# Patient Record
Sex: Female | Born: 1980 | Race: White | Hispanic: No | Marital: Single | Smoking: Current every day smoker
Health system: Southern US, Community
[De-identification: ages and names within clinical notes are randomized; demographics above are authoritative.]

## PROBLEM LIST (undated history)

## (undated) ENCOUNTER — Inpatient Hospital Stay (HOSPITAL_COMMUNITY): Payer: Self-pay

## (undated) DIAGNOSIS — N200 Calculus of kidney: Secondary | ICD-10-CM

## (undated) DIAGNOSIS — B1920 Unspecified viral hepatitis C without hepatic coma: Secondary | ICD-10-CM

## (undated) DIAGNOSIS — F1910 Other psychoactive substance abuse, uncomplicated: Secondary | ICD-10-CM

## (undated) DIAGNOSIS — Z3490 Encounter for supervision of normal pregnancy, unspecified, unspecified trimester: Secondary | ICD-10-CM

## (undated) HISTORY — PX: TONSILLECTOMY: SUR1361

## (undated) HISTORY — PX: STENT PLACE LEFT URETER (ARMC HX): HXRAD1254

## (undated) HISTORY — PX: STENT REMOVAL: SHX6421

## (undated) HISTORY — DX: Unspecified viral hepatitis C without hepatic coma: B19.20

## (undated) HISTORY — PX: LITHOTRIPSY: SUR834

## (undated) HISTORY — DX: Encounter for supervision of normal pregnancy, unspecified, unspecified trimester: Z34.90

---
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
## 2002-10-10 ENCOUNTER — Encounter: Payer: Self-pay | Admitting: Family Medicine

## 2002-10-10 ENCOUNTER — Ambulatory Visit (HOSPITAL_COMMUNITY): Admission: RE | Admit: 2002-10-10 | Discharge: 2002-10-10 | Payer: Self-pay | Admitting: Family Medicine

## 2002-11-05 ENCOUNTER — Other Ambulatory Visit: Admission: RE | Admit: 2002-11-05 | Discharge: 2002-11-05 | Payer: Self-pay | Admitting: Family Medicine

## 2003-02-27 ENCOUNTER — Ambulatory Visit (HOSPITAL_COMMUNITY): Admission: RE | Admit: 2003-02-27 | Discharge: 2003-02-27 | Payer: Self-pay | Admitting: Family Medicine

## 2003-02-27 ENCOUNTER — Encounter: Payer: Self-pay | Admitting: Family Medicine

## 2003-03-12 ENCOUNTER — Inpatient Hospital Stay (HOSPITAL_COMMUNITY): Admission: AD | Admit: 2003-03-12 | Discharge: 2003-03-14 | Payer: Self-pay | Admitting: Family Medicine

## 2003-03-12 ENCOUNTER — Encounter: Admission: RE | Admit: 2003-03-12 | Discharge: 2003-03-12 | Payer: Self-pay | Admitting: Family Medicine

## 2008-12-31 ENCOUNTER — Emergency Department (HOSPITAL_COMMUNITY): Admission: EM | Admit: 2008-12-31 | Discharge: 2008-12-31 | Payer: Self-pay | Admitting: Emergency Medicine

## 2011-05-29 ENCOUNTER — Emergency Department (HOSPITAL_COMMUNITY): Payer: Self-pay

## 2011-05-29 ENCOUNTER — Encounter: Payer: Self-pay | Admitting: Emergency Medicine

## 2011-05-29 ENCOUNTER — Emergency Department (HOSPITAL_COMMUNITY)
Admission: EM | Admit: 2011-05-29 | Discharge: 2011-05-29 | Disposition: A | Discharge status: Home / Self Care | Payer: Self-pay | Attending: Emergency Medicine | Admitting: Emergency Medicine

## 2011-05-29 DIAGNOSIS — W1800XA Striking against unspecified object with subsequent fall, initial encounter: Secondary | ICD-10-CM

## 2011-05-29 DIAGNOSIS — M549 Dorsalgia, unspecified: Secondary | ICD-10-CM

## 2011-05-29 DIAGNOSIS — W1809XA Striking against other object with subsequent fall, initial encounter: Secondary | ICD-10-CM | POA: Insufficient documentation

## 2011-05-29 DIAGNOSIS — F17200 Nicotine dependence, unspecified, uncomplicated: Secondary | ICD-10-CM | POA: Insufficient documentation

## 2011-05-29 DIAGNOSIS — M545 Low back pain, unspecified: Secondary | ICD-10-CM | POA: Insufficient documentation

## 2011-05-29 LAB — URINALYSIS, ROUTINE W REFLEX MICROSCOPIC
Bilirubin Urine: NEGATIVE
Glucose, UA: NEGATIVE mg/dL
Hgb urine dipstick: NEGATIVE
Ketones, ur: NEGATIVE mg/dL
Leukocytes, UA: NEGATIVE
Nitrite: NEGATIVE
Protein, ur: NEGATIVE mg/dL
Specific Gravity, Urine: 1.02 (ref 1.005–1.030)
Urobilinogen, UA: 0.2 mg/dL (ref 0.0–1.0)
pH: 7 (ref 5.0–8.0)

## 2011-05-29 MED ORDER — DIPHENHYDRAMINE HCL 50 MG/ML IJ SOLN
50.0000 mg | Freq: Once | INTRAMUSCULAR | Status: AC
  Administered 2011-05-29: 50 mg via INTRAMUSCULAR
  Filled 2011-05-29: qty 1

## 2011-05-29 MED ORDER — CYCLOBENZAPRINE HCL 10 MG PO TABS: 10.0000 mg | ORAL_TABLET | Freq: Three times a day (TID) | ORAL | Status: AC | PRN

## 2011-05-29 MED ORDER — HYDROCODONE-ACETAMINOPHEN 5-325 MG PO TABS: 2.0000 | ORAL_TABLET | Freq: Four times a day (QID) | ORAL | Status: AC | PRN

## 2011-05-29 MED ORDER — KETOROLAC TROMETHAMINE 60 MG/2ML IM SOLN
60.0000 mg | Freq: Once | INTRAMUSCULAR | Status: AC
  Administered 2011-05-29: 60 mg via INTRAMUSCULAR
  Filled 2011-05-29: qty 2

## 2011-05-29 NOTE — ED Notes (Signed)
Pt now advises RN that her name is spelled wrong on her name tag and she is worried it will affect her prescriptions, EDP and registration notified

## 2011-05-29 NOTE — ED Notes (Signed)
Patient complaining of back pain x 2 days. Denies injury. 

## 2011-05-29 NOTE — ED Notes (Signed)
Pt reports she fell a couple of days ago hitting back on ? Table, pt also reports she was recently tx for UTI

## 2011-05-29 NOTE — ED Provider Notes (Signed)
History     CSN: [id number]  Arrival date & time 05/29/11  0457   First MD Initiated Contact with Patient 05/29/11 [phone]      Chief Complaint  Patient presents with  . Back Pain    (Consider location/radiation/quality/duration/timing/severity/associated sxs/prior treatment) HPI Pt presents via EMS.  PT relates she fell two evenings ago when she tripped and fell hitting her back on the edge of a table. States she has a constant stinging pain that is keeping her awake. States she took 2 norco last night without relief and that is all she has tried to treat herself. She denies nausea, vomiting, hematuria, pain on urination, fever.   PT relates she had a UTI about a month ago treated with cipro.   Please note discrepency in history, patient denied any trauma or injury to EMS or nursing staff.   PCP none  History reviewed. No pertinent past medical history.  Past Surgical History  Procedure Date  . Tonsillectomy     History reviewed. No pertinent family history.  History  Substance Use Topics  . Smoking status: Current Everyday Smoker -- 1.0 packs/day    Types: Cigarettes  . Smokeless tobacco: Not on file  . Alcohol Use: Yes     occasionally  unemployed  OB History    Grav Para Term Preterm Abortions TAB SAB Ect Mult Living                  Review of Systems  All other systems reviewed and are negative.    Allergies  Tramadol  Home Medications  No current outpatient prescriptions on file.  BP 119/85  Pulse 83  Temp(Src) 98.4 F (36.9 C) (Oral)  Resp 18  Ht 5\' 3"  (1.6 m)  Wt 150 lb (68.04 kg)  BMI 26.57 kg/m2  SpO2 100%  LMP 05/23/2011  Vital signs normal    Physical Exam  Nursing note and vitals reviewed. Constitutional: She is oriented to person, place, and time. She appears well-developed and well-nourished.  Non-toxic appearance. She does not appear ill. She appears distressed.  HENT:  Head: Normocephalic and atraumatic.  Right Ear: External  ear normal.  Left Ear: External ear normal.  Nose: Nose normal. No mucosal edema or rhinorrhea.  Mouth/Throat: Oropharynx is clear and moist and mucous membranes are normal. No dental abscesses or uvula swelling.  Eyes: Conjunctivae and EOM are normal. Pupils are equal, round, and reactive to light.  Neck: Normal range of motion and full passive range of motion without pain. Neck supple.  Pulmonary/Chest: Effort normal. No respiratory distress. She has no rhonchi. She exhibits no crepitus.  Abdominal: Normal appearance.  Musculoskeletal: Normal range of motion. She exhibits no edema and no tenderness.       Back:       Moves all extremities well. Pt relates a well localized area in her mid lumbar spine without bruising, swelling, step-off, crepitance.  Neurological: She is alert and oriented to person, place, and time. She has normal strength. No cranial nerve deficit.  Skin: Skin is warm, dry and intact. No rash noted. No erythema. No pallor.  Psychiatric: She has a normal mood and affect. Her speech is normal and behavior is normal. Her mood appears not anxious.    ED Course  Procedures (including critical care time  Pt had Lumbar spine xrays done in 2010 that were normal.   She was given toradol and benadryl IM. Pt's pain is better.  Results for orders placed during the  hospital encounter of 05/29/11  URINALYSIS, ROUTINE W REFLEX MICROSCOPIC      Component Value Range   Color, Urine YELLOW  YELLOW    APPearance CLEAR  CLEAR    Specific Gravity, Urine 1.020  1.005 - 1.030    pH 7.0  5.0 - 8.0    Glucose, UA NEGATIVE  NEGATIVE (mg/dL)   Hgb urine dipstick NEGATIVE  NEGATIVE    Bilirubin Urine NEGATIVE  NEGATIVE    Ketones, ur NEGATIVE  NEGATIVE (mg/dL)   Protein, ur NEGATIVE  NEGATIVE (mg/dL)   Urobilinogen, UA 0.2  0.0 - 1.0 (mg/dL)   Nitrite NEGATIVE  NEGATIVE    Leukocytes, UA NEGATIVE  NEGATIVE    Laboratory interpretation all normal .  Dg Lumbar Spine  Complete  05/29/2011  *RADIOLOGY REPORT*  Clinical Data: Low back pain.  Fall.  Back trauma.  LUMBAR SPINE - COMPLETE 4+ VIEW  Comparison: None.  Findings: Five lumbar type vertebral bodies.  Anatomic alignment. No pars defects.  Vertebral body height and intervertebral disc spaces preserved.  Lumbosacral junction appears normal.  IMPRESSION: Negative lumbar spine radiographs.  Original Report Authenticated By: Andreas Newport, M.D.    Diagnoses that have been ruled out:  Diagnoses that are still under consideration:  Final diagnoses:  Back pain  Fall against object   New Prescriptions   CYCLOBENZAPRINE (FLEXERIL) 10 MG TABLET    Take 1 tablet (10 mg total) by mouth 3 (three) times daily as needed for muscle spasms.   HYDROCODONE-ACETAMINOPHEN (NORCO) 5-325 MG PER TABLET    Take 2 tablets by mouth every 6 (six) hours as needed for pain.   Plan discharge  Devoria Albe, MD, FACEP    MDM          Ward Givens, MD 05/29/11 [phone]

## 2011-08-03 ENCOUNTER — Emergency Department (HOSPITAL_COMMUNITY)
Admission: EM | Admit: 2011-08-03 | Discharge: 2011-08-03 | Disposition: A | Discharge status: Home / Self Care | Payer: Self-pay | Attending: Emergency Medicine | Admitting: Emergency Medicine

## 2011-08-03 ENCOUNTER — Emergency Department (HOSPITAL_COMMUNITY): Payer: Self-pay

## 2011-08-03 ENCOUNTER — Encounter (HOSPITAL_COMMUNITY): Payer: Self-pay | Admitting: Emergency Medicine

## 2011-08-03 DIAGNOSIS — A499 Bacterial infection, unspecified: Secondary | ICD-10-CM | POA: Insufficient documentation

## 2011-08-03 DIAGNOSIS — N946 Dysmenorrhea, unspecified: Secondary | ICD-10-CM | POA: Insufficient documentation

## 2011-08-03 DIAGNOSIS — F17200 Nicotine dependence, unspecified, uncomplicated: Secondary | ICD-10-CM | POA: Insufficient documentation

## 2011-08-03 DIAGNOSIS — B9689 Other specified bacterial agents as the cause of diseases classified elsewhere: Secondary | ICD-10-CM | POA: Insufficient documentation

## 2011-08-03 DIAGNOSIS — N760 Acute vaginitis: Secondary | ICD-10-CM | POA: Insufficient documentation

## 2011-08-03 LAB — WET PREP, GENITAL: Trich, Wet Prep: NONE SEEN

## 2011-08-03 LAB — URINALYSIS, ROUTINE W REFLEX MICROSCOPIC
Hgb urine dipstick: NEGATIVE
Nitrite: NEGATIVE
Protein, ur: NEGATIVE mg/dL
Specific Gravity, Urine: 1.03 (ref 1.005–1.030)
Urobilinogen, UA: 0.2 mg/dL (ref 0.0–1.0)

## 2011-08-03 LAB — POCT PREGNANCY, URINE: Preg Test, Ur: NEGATIVE

## 2011-08-03 MED ORDER — OXYCODONE-ACETAMINOPHEN 5-325 MG PO TABS: 1.0000 | ORAL_TABLET | ORAL | Status: DC | PRN

## 2011-08-03 MED ORDER — IBUPROFEN 600 MG PO TABS: 600.0000 mg | ORAL_TABLET | Freq: Four times a day (QID) | ORAL | Status: DC | PRN

## 2011-08-03 MED ORDER — KETOROLAC TROMETHAMINE 60 MG/2ML IM SOLN
60.0000 mg | Freq: Once | INTRAMUSCULAR | Status: AC
  Administered 2011-08-03: 60 mg via INTRAMUSCULAR
  Filled 2011-08-03: qty 2

## 2011-08-03 MED ORDER — OXYCODONE-ACETAMINOPHEN 5-325 MG PO TABS
1.0000 | ORAL_TABLET | Freq: Once | ORAL | Status: AC
  Administered 2011-08-03: 1 via ORAL
  Filled 2011-08-03: qty 1

## 2011-08-03 NOTE — ED Notes (Signed)
Pt states has had vaginal pain for 6 months. Scheduled for exam in April but pain became much worse.

## 2011-08-03 NOTE — Discharge Instructions (Signed)
Dysmenorrhea Menstrual pain is caused by the muscles of the uterus tightening (contracting) during a menstrual period. The muscles of the uterus contract due to the chemicals in the uterine lining. Primary dysmenorrhea is menstrual cramps that last a couple of days when you start having menstrual periods or soon after. This often begins after a teenager starts having her period. As a woman gets older or has a baby, the cramps will usually lesson or disappear. Secondary dysmenorrhea begins later in life, lasts longer, and the pain may be stronger than primary dysmenorrhea. The pain may start before the period and last a few days after the period. This type of dysmenorrhea is usually caused by an underlying problem such as:  The tissue lining the uterus grows outside of the uterus in other areas of the body (endometriosis).   The endometrial tissue, which normally lines the uterus, is found in or grows into the muscular walls of the uterus (adenomyosis).   The pelvic blood vessels are engorged with blood just before the menstrual period (pelvic congestive syndrome).   Overgrowth of cells in the lining of the uterus or cervix (polyps of the uterus or cervix).   Falling down of the uterus (prolapse) because of loose or stretched ligaments.   Depression.   Bladder problems, infection, or inflammation.   Problems with the intestine, a tumor, or irritable bowel syndrome.   Cancer of the female organs or bladder.   A severely tipped uterus.   A very tight opening or closed cervix.   Noncancerous tumors of the uterus (fibroids).   Pelvic inflammatory disease (PID).   Pelvic scarring (adhesions) from a previous surgery.   Ovarian cyst.   An intrauterine device (IUD) used for birth control.  CAUSES  The cause of menstrual pain is often unknown. SYMPTOMS   Cramping or throbbing pain in your lower abdomen.   Sometimes, a woman may also experience headaches.   Lower back pain.    Feeling sick to your stomach (nausea) or vomiting.   Diarrhea.   Sweating or dizziness.  DIAGNOSIS  A diagnosis is based on your history, symptoms, physical examination, diagnostic tests, or procedures. Diagnostic tests or procedures may include:  Blood tests.   An ultrasound.   An examination of the lining of the uterus (dilation and curettage, D&C).   An examination inside your abdomen or pelvis with a scope (laparoscopy).   X-rays.   CT Scan.   MRI.   An examination inside the bladder with a scope (cystoscopy).   An examination inside the intestine or stomach with a scope (colonoscopy, gastroscopy).  TREATMENT  Treatment depends on the cause of the dysmenorrhea. Treatment may include:  Pain medicine prescribed by your caregiver.   Birth control pills.   Hormone replacement therapy.   Nonsteroidal anti-inflammatory drugs (NSAIDs). These may help stop the production of prostaglandins.   An IUD with progesterone hormone in it.   Acupuncture.   Surgery to remove adhesions, endometriosis, ovarian cyst, or fibroids.   Removal of the uterus (hysterectomy).   Progesterone shots to stop the menstrual period.   Cutting the nerves on the sacrum that go to the female organs (presacral neurectomy).   Electric currant to the sacral nerves (sacral nerve stimulation).   Antidepressant medicine.   Psychiatric therapy, counseling, or group therapy.   Exercise and physical therapy.   Meditation and yoga therapy.  HOME CARE INSTRUCTIONS   Only take over-the-counter or prescription medicines for pain, discomfort, or fever as directed by your   caregiver.   Place a heating pad or hot water bottle on your lower back or abdomen. Do not sleep with the heating pad.   Use aerobic exercises, walking, swimming, biking, and other exercises to help lessen the cramping.   Massage to the lower back or abdomen may help.   Stop smoking.   Avoid alcohol and caffeine.   Yoga,  meditation, or acupuncture may help.  SEEK MEDICAL CARE IF:   The pain does not get better with medicine.   You have pain with sexual intercourse.  SEEK IMMEDIATE MEDICAL CARE IF:   Your pain increases and is not controlled with medicines.   You have a fever.   You develop nausea or vomiting with your period not controlled with medicine.   You have abnormal vaginal bleeding with your period.   You pass out.  MAKE SURE YOU:   Understand these instructions.   Will watch your condition.   Will get help right away if you are not doing well or get worse.  Document Released: 05/08/2005 Document Revised: 04/27/2011 Document Reviewed: 08/24/2008 [facility] Patient Information 2012 [place], [place].   As discussed, call your doctor at the health department for further evaluation of your pain and to discuss perhaps placing you on a different oral contraceptive so you will have a more meaningful withdrawal bleeding.  You may need a repeat ultrasound in 6-8 weeks to make sure the lining of your uterus has returned to a normal thickness.  In the interim,  You may take the medicine prescribed for pain.  Do not drive within 4 hours of taking as this will make you sleepy.

## 2011-08-04 LAB — GC/CHLAMYDIA PROBE AMP, GENITAL: Chlamydia, DNA Probe: NEGATIVE

## 2011-08-04 MED ORDER — METRONIDAZOLE 500 MG PO TABS: 500.0000 mg | ORAL_TABLET | Freq: Two times a day (BID) | ORAL | Status: DC

## 2011-08-04 NOTE — ED Provider Notes (Signed)
History     CSN: [id number]  Arrival date & time 08/03/11  0820   First MD Initiated Contact with Patient 08/03/11 [phone]      Chief Complaint  Patient presents with  . Vaginal Pain    (Consider location/radiation/quality/duration/timing/severity/associated sxs/prior treatment) HPI Comments: This gravida 3 para 2 abo 1 patient presents for evaluation of a six-month history of fairly persistent vaginal pain which she describes as menstrual cramping which occurs almost daily.  Over the past 24 hours her pain has been constant and severe.  History is significant for an elective abortion 6 months ago which is when her symptoms started to intensify.  At that time she was also placed on Loestrin FE E4 birth control but also to help regulate her periods.  She states she has not had a real menstrual flow since been on these pills.  She is scheduled to see her PCP at the health department next month in followup of this tissue.  Also reports a history of abnormal cervical cells for which she is also overdue for further management of this.  She denies fevers and chills, no nausea or vomiting, no significant or abnormal vaginal discharge.  Patient is a 31 y.o. female presenting with vaginal pain. The history is provided by the patient.  Vaginal Pain The problem occurs constantly. The problem has been gradually worsening. Pertinent negatives include no abdominal pain, anorexia, arthralgias, change in bowel habit, chest pain, chills, congestion, fever, headaches, joint swelling, nausea, neck pain, numbness, rash, sore throat, urinary symptoms, vomiting or weakness. The symptoms are aggravated by nothing. She has tried acetaminophen for the symptoms. The treatment provided no relief.    History reviewed. No pertinent past medical history.  Past Surgical History  Procedure Date  . Tonsillectomy     History reviewed. No pertinent family history.  History  Substance Use Topics  . Smoking status: Current  Everyday Smoker -- 1.0 packs/day    Types: Cigarettes  . Smokeless tobacco: Not on file  . Alcohol Use: Yes     occasionally    OB History    Grav Para Term Preterm Abortions TAB SAB Ect Mult Living   5 2   1            Review of Systems  Constitutional: Negative for fever and chills.  HENT: Negative for congestion, sore throat and neck pain.   Eyes: Negative.   Respiratory: Negative for chest tightness and shortness of breath.   Cardiovascular: Negative for chest pain.  Gastrointestinal: Negative for nausea, vomiting, abdominal pain, anorexia and change in bowel habit.  Genitourinary: Positive for pelvic pain. Negative for dysuria, urgency, frequency, hematuria, vaginal discharge and dyspareunia.  Musculoskeletal: Negative for joint swelling and arthralgias.  Skin: Negative.  Negative for rash and wound.  Neurological: Negative for dizziness, weakness, light-headedness, numbness and headaches.  Hematological: Negative.   Psychiatric/Behavioral: Negative.     Allergies  Tramadol  Home Medications   Current Outpatient Rx  Name Route Sig Dispense Refill  . IBUPROFEN 600 MG PO TABS Oral Take 1 tablet (600 mg total) by mouth every 6 (six) hours as needed for pain. 30 tablet 0  . OXYCODONE-ACETAMINOPHEN 5-325 MG PO TABS Oral Take 1 tablet by mouth every 4 (four) hours as needed for pain. 20 tablet 0    BP 102/59  Pulse 79  Temp(Src) 99.2 F (37.3 C) (Oral)  Resp 22  Ht 5\' 2"  (1.575 m)  Wt 150 lb (68.04 kg)  BMI 27.44 kg/m2  SpO2 98%  LMP 05/23/2011  Physical Exam  Nursing note and vitals reviewed. Constitutional: She is oriented to person, place, and time. She appears well-developed and well-nourished.  HENT:  Head: Normocephalic and atraumatic.  Eyes: Conjunctivae are normal.  Neck: Normal range of motion.  Cardiovascular: Normal rate, regular rhythm, normal heart sounds and intact distal pulses.   Pulmonary/Chest: Effort normal and breath sounds normal. She has  no wheezes.  Abdominal: Soft. Bowel sounds are normal. There is no tenderness.  Genitourinary: There is no tenderness on the right labia. There is no tenderness on the left labia. Uterus is tender. Uterus is not enlarged. Right adnexum displays no mass, no tenderness and no fullness. Left adnexum displays no mass, no tenderness and no fullness. No erythema or tenderness around the vagina. Vaginal discharge found.  Musculoskeletal: Normal range of motion.  Neurological: She is alert and oriented to person, place, and time.  Skin: Skin is warm and dry.  Psychiatric: She has a normal mood and affect.    ED Course  Procedures (including critical care time)  Labs Reviewed  WET PREP, GENITAL - Abnormal; Notable for the following:    Clue Cells Wet Prep HPF POC MANY (*)    WBC, Wet Prep HPF POC MANY (*)    All other components within normal limits  GC/CHLAMYDIA PROBE AMP, GENITAL  RPR  URINALYSIS, ROUTINE W REFLEX MICROSCOPIC  POCT PREGNANCY, URINE   US Transvaginal Non-ob  08/03/2011  *RADIOLOGY REPORT*  Clinical Data: Pelvic pain.  TRANSABDOMINAL AND TRANSVAGINAL ULTRASOUND OF PELVIS  Technique:  Both transabdominal and transvaginal ultrasound examinations of the pelvis were performed including evaluation of the uterus, ovaries, adnexal regions, and pelvic cul-de-sac.  Comparison: None.  Findings:  Uterus: 8.2 x 3.9 x 5.5 cm.  Normal echotexture.  No focal abnormality.  Endometrium: 10 mm in thickness. Heterogeneous echotexture.  Right Ovary: 4.6 x 3.1 x 3.2 cm.  1.8 cm dominant follicle.  Left Ovary: 4.0 x 2.9 x 2.1 cm.  Focal 2.1 cm slightly hypoechoic area within the right ovary, likely small hemorrhagic cyst.  Other Findings:  No free fluid.  IMPRESSION: Endometrium is upper limits normal in thickness but heterogeneous. This can be followed in 6-8 weeks to assure resolution.  Otherwise unremarkable.  Original Report Authenticated By: Cyndie Chime, M.D.   US Pelvis Complete  08/03/2011   *RADIOLOGY REPORT*  Clinical Data: Pelvic pain.  TRANSABDOMINAL AND TRANSVAGINAL ULTRASOUND OF PELVIS  Technique:  Both transabdominal and transvaginal ultrasound examinations of the pelvis were performed including evaluation of the uterus, ovaries, adnexal regions, and pelvic cul-de-sac.  Comparison: None.  Findings:  Uterus: 8.2 x 3.9 x 5.5 cm.  Normal echotexture.  No focal abnormality.  Endometrium: 10 mm in thickness. Heterogeneous echotexture.  Right Ovary: 4.6 x 3.1 x 3.2 cm.  1.8 cm dominant follicle.  Left Ovary: 4.0 x 2.9 x 2.1 cm.  Focal 2.1 cm slightly hypoechoic area within the right ovary, likely small hemorrhagic cyst.  Other Findings:  No free fluid.  IMPRESSION: Endometrium is upper limits normal in thickness but heterogeneous. This can be followed in 6-8 weeks to assure resolution.  Otherwise unremarkable.  Original Report Authenticated By: Cyndie Chime, M.D.     1. Dysmenorrhea   2. Bacterial vaginosis       MDM  Patient encouraged to contact her PCP today to discuss either discontinuing her Loestrin or changing to a biphasic pill  which may help better regulate her menstrual cramping and have  more regular withdrawal bleeding cycles.  Also will prescribe Flagyl for bacterial vaginosis.  GC and Chlamydia cultures are pending.  Patient denies risk factors for STDs, states in a stable long-term relationship.        Candis Musa, PA 08/04/11 [phone]

## 2011-08-04 NOTE — ED Provider Notes (Signed)
Medical screening examination/treatment/procedure(s) were performed by non-physician practitioner and as supervising physician I was immediately available for consultation/collaboration.  Maelie Chriswell, MD 08/04/11 0821 

## 2011-08-08 ENCOUNTER — Emergency Department (HOSPITAL_COMMUNITY)
Admission: EM | Admit: 2011-08-08 | Discharge: 2011-08-08 | Disposition: A | Discharge status: Home / Self Care | Payer: Self-pay | Attending: Emergency Medicine | Admitting: Emergency Medicine

## 2011-08-08 ENCOUNTER — Encounter (HOSPITAL_COMMUNITY): Payer: Self-pay

## 2011-08-08 DIAGNOSIS — F17200 Nicotine dependence, unspecified, uncomplicated: Secondary | ICD-10-CM | POA: Insufficient documentation

## 2011-08-08 DIAGNOSIS — N949 Unspecified condition associated with female genital organs and menstrual cycle: Secondary | ICD-10-CM | POA: Insufficient documentation

## 2011-08-08 DIAGNOSIS — R102 Pelvic and perineal pain: Secondary | ICD-10-CM

## 2011-08-08 MED ORDER — OXYCODONE-ACETAMINOPHEN 5-325 MG PO TABS: 2.0000 | ORAL_TABLET | ORAL | Status: DC | PRN

## 2011-08-08 NOTE — ED Notes (Signed)
Pt was here Thursday and had a complete work up for pelvic pain. States she has an appointment at the health department April 2nd. States she is still in pain. Got Rx  For percocet # 20 on 3/1/4 and has taken them all

## 2011-08-08 NOTE — ED Provider Notes (Signed)
History   This chart was scribed for [employer]. Rubin Payor, MD by Clarita Crane. The patient was seen in room APA12/APA12. Patient's care was started at 1124.    CSN: [id number]  Arrival date & time 08/08/11  1124   First MD Initiated Contact with Patient 08/08/11 1250      Chief Complaint  Patient presents with  . Pelvic Pain    (Consider location/radiation/quality/duration/timing/severity/associated sxs/prior treatment) HPI Marisa Kirby is a 31 y.o. female who presents to the Emergency Department complaining of waxing and waning moderate to severe abdominal pain described as cramping onset 1 month ago and worsening since with associated increased urinary frequency. Patient states she was evaluated in ED last week by Herbie Drape and had abdominal US performed. Denies fever, dysuria, vaginal bleeding. Patient notes she has an appointment with [facility] in 2 weeks regarding abdominal cramping.   History reviewed. No pertinent past medical history.  Past Surgical History  Procedure Date  . Tonsillectomy     History reviewed. No pertinent family history.  History  Substance Use Topics  . Smoking status: Current Everyday Smoker -- 1.0 packs/day    Types: Cigarettes  . Smokeless tobacco: Not on file  . Alcohol Use: Yes     occasionally    OB History    Grav Para Term Preterm Abortions TAB SAB Ect Mult Living   5 2   1            Review of Systems 10 Systems reviewed and are negative for acute change except as noted in the HPI.  Allergies  Tramadol  Home Medications   Current Outpatient Rx  Name Route Sig Dispense Refill  . IBUPROFEN 200 MG PO TABS Oral Take 200 mg by mouth every 6 (six) hours as needed. Pain    . METRONIDAZOLE 500 MG PO TABS Oral Take 1 tablet (500 mg total) by mouth 2 (two) times daily. 14 tablet 0  . OXYCODONE-ACETAMINOPHEN 5-325 MG PO TABS Oral Take 1 tablet by mouth every 4 (four) hours as needed for pain. 20 tablet 0  . OXYCODONE-ACETAMINOPHEN  5-325 MG PO TABS Oral Take 2 tablets by mouth every 4 (four) hours as needed for pain. 15 tablet 0    BP 110/74  Pulse 98  Temp 98.1 F (36.7 C)  Resp 20  Ht 5\' 3"  (1.6 m)  Wt 150 lb (68.04 kg)  BMI 26.57 kg/m2  SpO2 100%  LMP 05/23/2011  Physical Exam  Nursing note and vitals reviewed. Constitutional: She is oriented to person, place, and time. She appears well-developed and well-nourished. No distress.  HENT:  Head: Normocephalic and atraumatic.  Eyes: EOM are normal. Pupils are equal, round, and reactive to light.  Neck: Neck supple. No tracheal deviation present.  Cardiovascular: Normal rate.   Pulmonary/Chest: Effort normal. No respiratory distress.  Abdominal: Soft. Bowel sounds are normal. She exhibits no distension. There is tenderness (mild) in the right lower quadrant. There is no rebound and no guarding.  Musculoskeletal: Normal range of motion. She exhibits no edema.  Neurological: She is alert and oriented to person, place, and time. No sensory deficit.  Skin: Skin is warm and dry.  Psychiatric: She has a normal mood and affect. Her behavior is normal.    ED Course  Procedures (including critical care time)  DIAGNOSTIC STUDIES: Oxygen Saturation is 100% on room air, normal by my interpretation.    COORDINATION OF CARE: 1:00PM-Patient informed of current plan for treatment and evaluation and agrees with  plan at this time.     Labs Reviewed - No data to display No results found.   1. Pelvic pain       MDM  Pelvic pain for months now. Recently seen for the same. Diagnosed with bacterial vaginosis that time. She had negative ultrasound that time 2. She has followup with the [facility]. Gonorrhea and Chlamydia tests are now negative. Urinalysis was -5 days ago. She'll be discharged home with pain medications.      I personally performed the services described in this documentation, which was scribed in my presence. The recorded information has  been reviewed and considered.     Juliet Rude. Rubin Payor, MD 08/08/11 1352

## 2011-08-08 NOTE — ED Notes (Signed)
Pt c/o lower abdominal cramping. States that she was seen last Thursday and cannot be seen at the [facility] until April 2. Pt states that she has no more pain meds. C/o feeling like "i am in labor".

## 2011-08-08 NOTE — Discharge Instructions (Signed)
Abdominal Pain, Women Abdominal (stomach, pelvic, or belly) pain can be caused by many things. It is important to tell your doctor:  The location of the pain.   Does it come and go or is it present all the time?   Are there things that start the pain (eating certain foods, exercise)?   Are there other symptoms associated with the pain (fever, nausea, vomiting, diarrhea)?  All of this is helpful to know when trying to find the cause of the pain. CAUSES   Stomach: virus or bacteria infection, or ulcer.   Intestine: appendicitis (inflamed appendix), regional ileitis (Crohn's disease), ulcerative colitis (inflamed colon), irritable bowel syndrome, diverticulitis (inflamed diverticulum of the colon), or cancer of the stomach or intestine.   Gallbladder disease or stones in the gallbladder.   Kidney disease, kidney stones, or infection.   Pancreas infection or cancer.   Fibromyalgia (pain disorder).   Diseases of the female organs:   Uterus: fibroid (non-cancerous) tumors or infection.   Fallopian tubes: infection or tubal pregnancy.   Ovary: cysts or tumors.   Pelvic adhesions (scar tissue).   Endometriosis (uterus lining tissue growing in the pelvis and on the pelvic organs).   Pelvic congestion syndrome (female organs filling up with blood just before the menstrual period).   Pain with the menstrual period.   Pain with ovulation (producing an egg).   Pain with an IUD (intrauterine device, birth control) in the uterus.   Cancer of the female organs.   Functional pain (pain not caused by a disease, may improve without treatment).   Psychological pain.   Depression.  DIAGNOSIS  Your doctor will decide the seriousness of your pain by doing an examination.  Blood tests.   X-rays.   Ultrasound.   CT scan (computed tomography, special type of X-ray).   MRI (magnetic resonance imaging).   Cultures, for infection.   Barium enema (dye inserted in the large  intestine, to better view it with X-rays).   Colonoscopy (looking in intestine with a lighted tube).   Laparoscopy (minor surgery, looking in abdomen with a lighted tube).   Major abdominal exploratory surgery (looking in abdomen with a large incision).  TREATMENT  The treatment will depend on the cause of the pain.   Many cases can be observed and treated at home.   Over-the-counter medicines recommended by your caregiver.   Prescription medicine.   Antibiotics, for infection.   Birth control pills, for painful periods or for ovulation pain.   Hormone treatment, for endometriosis.   Nerve blocking injections.   Physical therapy.   Antidepressants.   Counseling with a psychologist or psychiatrist.   Minor or major surgery.  HOME CARE INSTRUCTIONS   Do not take laxatives, unless directed by your caregiver.   Take over-the-counter pain medicine only if ordered by your caregiver. Do not take aspirin because it can cause an upset stomach or bleeding.   Try a clear liquid diet (broth or water) as ordered by your caregiver. Slowly move to a bland diet, as tolerated, if the pain is related to the stomach or intestine.   Have a thermometer and take your temperature several times a day, and record it.   Bed rest and sleep, if it helps the pain.   Avoid sexual intercourse, if it causes pain.   Avoid stressful situations.   Keep your follow-up appointments and tests, as your caregiver orders.   If the pain does not go away with medicine or surgery, you may   try:   Acupuncture.   Relaxation exercises (yoga, meditation).   Group therapy.   Counseling.  SEEK MEDICAL CARE IF:   You notice certain foods cause stomach pain.   Your home care treatment is not helping your pain.   You need stronger pain medicine.   You want your IUD removed.   You feel faint or lightheaded.   You develop nausea and vomiting.   You develop a rash.   You are having side effects or  an allergy to your medicine.  SEEK IMMEDIATE MEDICAL CARE IF:   Your pain does not go away or gets worse.   You have a fever.   Your pain is felt only in portions of the abdomen. The right side could possibly be appendicitis. The left lower portion of the abdomen could be colitis or diverticulitis.   You are passing blood in your stools (bright red or black tarry stools, with or without vomiting).   You have blood in your urine.   You develop chills, with or without a fever.   You pass out.  MAKE SURE YOU:   Understand these instructions.   Will watch your condition.   Will get help right away if you are not doing well or get worse.  Document Released: 03/05/2007 Document Revised: 04/27/2011 Document Reviewed: 03/25/2009 [employer] Patient Information 2012 [employer]. 

## 2011-08-12 ENCOUNTER — Emergency Department (HOSPITAL_COMMUNITY)
Admission: EM | Admit: 2011-08-12 | Discharge: 2011-08-12 | Disposition: A | Discharge status: Home / Self Care | Payer: Self-pay | Attending: Emergency Medicine | Admitting: Emergency Medicine

## 2011-08-12 ENCOUNTER — Encounter (HOSPITAL_COMMUNITY): Payer: Self-pay | Admitting: Emergency Medicine

## 2011-08-12 DIAGNOSIS — F17200 Nicotine dependence, unspecified, uncomplicated: Secondary | ICD-10-CM | POA: Insufficient documentation

## 2011-08-12 DIAGNOSIS — R109 Unspecified abdominal pain: Secondary | ICD-10-CM | POA: Insufficient documentation

## 2011-08-12 DIAGNOSIS — N946 Dysmenorrhea, unspecified: Secondary | ICD-10-CM | POA: Insufficient documentation

## 2011-08-12 DIAGNOSIS — N898 Other specified noninflammatory disorders of vagina: Secondary | ICD-10-CM | POA: Insufficient documentation

## 2011-08-12 LAB — HCG, QUANTITATIVE, PREGNANCY: hCG, Beta Chain, Quant, S: <1 m[IU]/mL (ref <5)

## 2011-08-12 MED ORDER — ONDANSETRON HCL 4 MG/2ML IJ SOLN
4.0000 mg | Freq: Once | INTRAMUSCULAR | Status: AC
  Administered 2011-08-12: 4 mg via INTRAVENOUS
  Filled 2011-08-12: qty 2

## 2011-08-12 MED ORDER — HYDROMORPHONE HCL PF 1 MG/ML IJ SOLN
1.0000 mg | Freq: Once | INTRAMUSCULAR | Status: AC
  Administered 2011-08-12: 1 mg via INTRAVENOUS
  Filled 2011-08-12: qty 1

## 2011-08-12 MED ORDER — OXYCODONE-ACETAMINOPHEN 5-325 MG PO TABS: 1.0000 | ORAL_TABLET | ORAL | Status: AC | PRN

## 2011-08-12 NOTE — ED Provider Notes (Signed)
History     CSN: [id number]  Arrival date & time 08/12/11  1451   First MD Initiated Contact with Patient 08/12/11 1546      Chief Complaint  Patient presents with  . Abdominal Pain  . Vaginal Bleeding    (Consider location/radiation/quality/duration/timing/severity/associated sxs/prior treatment) HPI Comments: Marisa Kirby is a 31 y.o. female who presents with pelvic pain and vaginal bleeding. The pelvic pain has been present for several weeks, has been evaluated here twice in the time frame, and is worsened today. She also started vaginal bleeding today. She states her last menstrual period was about 10 months ago. After being seen on 08/03/11; she stopped taking her Loestrin oral contraceptive. Apparently, she did this on her own. She had been told to see her primary care provider to consider changing the medication. She denies fever, chills, nausea, vomiting, weakness, shortness of breath, chest pain.  Patient is a 31 y.o. female presenting with abdominal pain and vaginal bleeding.  Abdominal Pain The primary symptoms of the illness include abdominal pain and vaginal bleeding.  Vaginal Bleeding Associated symptoms include abdominal pain.    History reviewed. No pertinent past medical history.  Past Surgical History  Procedure Date  . Tonsillectomy     History reviewed. No pertinent family history.  History  Substance Use Topics  . Smoking status: Current Everyday Smoker -- 1.0 packs/day    Types: Cigarettes  . Smokeless tobacco: Not on file  . Alcohol Use: Yes     occasionally    OB History    Grav Para Term Preterm Abortions TAB SAB Ect Mult Living   5 2   1            Review of Systems  Gastrointestinal: Positive for abdominal pain.  Genitourinary: Positive for vaginal bleeding.  All other systems reviewed and are negative.    Allergies  Tramadol  Home Medications   Current Outpatient Rx  Name Route Sig Dispense Refill  . IBUPROFEN 200 MG PO TABS  Oral Take 200 mg by mouth every 6 (six) hours as needed. Pain    . OXYCODONE-ACETAMINOPHEN 5-325 MG PO TABS Oral Take 1 tablet by mouth every 4 (four) hours as needed for pain. 15 tablet 0    BP 121/74  Pulse 79  Temp(Src) 97.9 F (36.6 C) (Oral)  Resp 17  Ht 5\' 3"  (1.6 m)  Wt 150 lb (68.04 kg)  BMI 26.57 kg/m2  SpO2 99%  LMP 08/12/2011  Physical Exam  Nursing note and vitals reviewed. Constitutional: She is oriented to person, place, and time. She appears well-developed and well-nourished.  HENT:  Head: Normocephalic and atraumatic.  Eyes: Conjunctivae and EOM are normal. Pupils are equal, round, and reactive to light.  Neck: Normal range of motion and phonation normal. Neck supple.  Cardiovascular: Normal rate, regular rhythm and intact distal pulses.   Pulmonary/Chest: Effort normal and breath sounds normal. She exhibits no tenderness.  Abdominal: Soft. She exhibits no distension. There is tenderness (moderate suprapubic tenderness bilaterally). There is no guarding.  Musculoskeletal: Normal range of motion.  Neurological: She is alert and oriented to person, place, and time. She has normal strength. She exhibits normal muscle tone.  Skin: Skin is warm and dry.  Psychiatric: Her behavior is normal. Judgment and thought content normal.       She is anxious    ED Course  Procedures (including critical care time)   Labs Reviewed  HCG, QUANTITATIVE, PREGNANCY   No results found. Emergency  department treatment: IV, Dilaudid, and Zofran.  6:33 PM Reevaluation with update and discussion. After initial assessment and treatment, an updated evaluation reveals pain better. Flynn Lininger L   1. Dysmenorrhea       MDM  Vaginal bleeding, related to stepping oral contraceptive in the middle of the treatment pack. This has resulted in dysmenorrhea. She is not pregnant. She is improved with treatment in the emergency department.     Plan: Home Medications-   Percocet; Home  Treatments- rest; Recommended follow up- f/u for initiation of oral contraceptives or further gynecology care as needed   Flint Melter, MD 08/12/11 [phone]

## 2011-08-12 NOTE — Discharge Instructions (Signed)
Dysmenorrhea Menstrual pain is caused by the muscles of the uterus tightening (contracting) during a menstrual period. The muscles of the uterus contract due to the chemicals in the uterine lining. Primary dysmenorrhea is menstrual cramps that last a couple of days when you start having menstrual periods or soon after. This often begins after a teenager starts having her period. As a woman gets older or has a baby, the cramps will usually lesson or disappear. Secondary dysmenorrhea begins later in life, lasts longer, and the pain may be stronger than primary dysmenorrhea. The pain may start before the period and last a few days after the period. This type of dysmenorrhea is usually caused by an underlying problem such as:  The tissue lining the uterus grows outside of the uterus in other areas of the body (endometriosis).   The endometrial tissue, which normally lines the uterus, is found in or grows into the muscular walls of the uterus (adenomyosis).   The pelvic blood vessels are engorged with blood just before the menstrual period (pelvic congestive syndrome).   Overgrowth of cells in the lining of the uterus or cervix (polyps of the uterus or cervix).   Falling down of the uterus (prolapse) because of loose or stretched ligaments.   Depression.   Bladder problems, infection, or inflammation.   Problems with the intestine, a tumor, or irritable bowel syndrome.   Cancer of the female organs or bladder.   A severely tipped uterus.   A very tight opening or closed cervix.   Noncancerous tumors of the uterus (fibroids).   Pelvic inflammatory disease (PID).   Pelvic scarring (adhesions) from a previous surgery.   Ovarian cyst.   An intrauterine device (IUD) used for birth control.  CAUSES  The cause of menstrual pain is often unknown. SYMPTOMS   Cramping or throbbing pain in your lower abdomen.   Sometimes, a woman may also experience headaches.   Lower back pain.    Feeling sick to your stomach (nausea) or vomiting.   Diarrhea.   Sweating or dizziness.  DIAGNOSIS  A diagnosis is based on your history, symptoms, physical examination, diagnostic tests, or procedures. Diagnostic tests or procedures may include:  Blood tests.   An ultrasound.   An examination of the lining of the uterus (dilation and curettage, D&C).   An examination inside your abdomen or pelvis with a scope (laparoscopy).   X-rays.   CT Scan.   MRI.   An examination inside the bladder with a scope (cystoscopy).   An examination inside the intestine or stomach with a scope (colonoscopy, gastroscopy).  TREATMENT  Treatment depends on the cause of the dysmenorrhea. Treatment may include:  Pain medicine prescribed by your caregiver.   Birth control pills.   Hormone replacement therapy.   Nonsteroidal anti-inflammatory drugs (NSAIDs). These may help stop the production of prostaglandins.   An IUD with progesterone hormone in it.   Acupuncture.   Surgery to remove adhesions, endometriosis, ovarian cyst, or fibroids.   Removal of the uterus (hysterectomy).   Progesterone shots to stop the menstrual period.   Cutting the nerves on the sacrum that go to the female organs (presacral neurectomy).   Electric currant to the sacral nerves (sacral nerve stimulation).   Antidepressant medicine.   Psychiatric therapy, counseling, or group therapy.   Exercise and physical therapy.   Meditation and yoga therapy.  HOME CARE INSTRUCTIONS   Only take over-the-counter or prescription medicines for pain, discomfort, or fever as directed by your   caregiver.   Place a heating pad or hot water bottle on your lower back or abdomen. Do not sleep with the heating pad.   Use aerobic exercises, walking, swimming, biking, and other exercises to help lessen the cramping.   Massage to the lower back or abdomen may help.   Stop smoking.   Avoid alcohol and caffeine.   Yoga,  meditation, or acupuncture may help.  SEEK MEDICAL CARE IF:   The pain does not get better with medicine.   You have pain with sexual intercourse.  SEEK IMMEDIATE MEDICAL CARE IF:   Your pain increases and is not controlled with medicines.   You have a fever.   You develop nausea or vomiting with your period not controlled with medicine.   You have abnormal vaginal bleeding with your period.   You pass out.  MAKE SURE YOU:   Understand these instructions.   Will watch your condition.   Will get help right away if you are not doing well or get worse.  Document Released: 05/08/2005 Document Revised: 04/27/2011 Document Reviewed: 08/24/2008 [employer] Patient Information 2012 [employer]. 

## 2011-08-12 NOTE — ED Notes (Signed)
MD at bedside. 

## 2011-08-12 NOTE — ED Notes (Signed)
Pt c/o vaginal bleeding and abd pain. Pt has been here for the same several times.

## 2011-08-31 ENCOUNTER — Emergency Department (HOSPITAL_COMMUNITY)
Admission: EM | Admit: 2011-08-31 | Discharge: 2011-08-31 | Discharge status: Against Medical Advice | Payer: Medicaid Other | Attending: Emergency Medicine | Admitting: Emergency Medicine

## 2011-08-31 ENCOUNTER — Encounter (HOSPITAL_COMMUNITY): Payer: Self-pay | Admitting: *Deleted

## 2011-08-31 DIAGNOSIS — F17200 Nicotine dependence, unspecified, uncomplicated: Secondary | ICD-10-CM | POA: Insufficient documentation

## 2011-08-31 DIAGNOSIS — M542 Cervicalgia: Secondary | ICD-10-CM | POA: Insufficient documentation

## 2011-08-31 MED ORDER — KETOROLAC TROMETHAMINE 30 MG/ML IJ SOLN: 15.0000 mg | Freq: Once | INTRAMUSCULAR | Status: DC

## 2011-08-31 MED ORDER — SODIUM CHLORIDE 0.9 % IV SOLN: INTRAVENOUS | Status: DC

## 2011-08-31 MED ORDER — SODIUM CHLORIDE 0.9 % IV BOLUS (SEPSIS): 1000.0000 mL | Freq: Once | INTRAVENOUS | Status: DC

## 2011-08-31 MED ORDER — PROMETHAZINE HCL 25 MG/ML IJ SOLN: 12.5000 mg | Freq: Once | INTRAMUSCULAR | Status: DC

## 2011-08-31 NOTE — ED Notes (Signed)
Sore throat with swelling of neck

## 2011-08-31 NOTE — ED Provider Notes (Signed)
History     CSN: [id number]  Arrival date & time 08/31/11  1316   First MD Initiated Contact with Patient 08/31/11 1511      Chief Complaint  Patient presents with  . Sore Throat    (Consider location/radiation/quality/duration/timing/severity/associated sxs/prior treatment) HPI Comments:  patient states that she started having problems with mild sore throat on yesterday April 10. At that time the throat was only" scratchy". As the night progressed the patient states that she started having pain and a sensation that something was swelling in her neck and causing some problems with swallowingher . She was able to swallow liquids and had no problems with her breathing no wheezing or similar symptoms. This morning she says that she would made a move with her neck and had severe pain of her neck and could not move it from that point on. she does not have problems Walking or using her upper extremities. She is only had a very low-grade temperature at home she states she thinks she may have had some mild chills but is unsure of that either. She's not had any unusual rash, and she has had her tonsils removed in the past. She tried Tylenol but this was unsuccessful in helping her pain and discomfort.  Patient is a 31 y.o. female presenting with pharyngitis. The history is provided by the patient.  Sore Throat This is a new problem. Associated symptoms include chills and a sore throat. Pertinent negatives include no abdominal pain, arthralgias, chest pain, coughing or neck pain.    History reviewed. No pertinent past medical history.  Past Surgical History  Procedure Date  . Tonsillectomy     No family history on file.  History  Substance Use Topics  . Smoking status: Current Everyday Smoker -- 1.0 packs/day    Types: Cigarettes  . Smokeless tobacco: Not on file  . Alcohol Use: Yes     occasionally    OB History    Grav Para Term Preterm Abortions TAB SAB Ect Mult Living   5 2   1             Review of Systems  Constitutional: Positive for chills. Negative for activity change.       All ROS Neg except as noted in HPI  HENT: Positive for sore throat. Negative for nosebleeds and neck pain.   Eyes: Negative for photophobia and discharge.  Respiratory: Negative for cough, shortness of breath and wheezing.   Cardiovascular: Negative for chest pain and palpitations.  Gastrointestinal: Negative for abdominal pain and blood in stool.  Genitourinary: Negative for dysuria, frequency and hematuria.  Musculoskeletal: Negative for back pain and arthralgias.       Neck pain  Skin: Negative.   Neurological: Negative for dizziness, seizures and speech difficulty.  Psychiatric/Behavioral: Negative for hallucinations and confusion.    Allergies  Tramadol  Home Medications  No current outpatient prescriptions on file.  BP 108/69  Pulse 102  Temp 99.6 F (37.6 C)  Resp 20  Ht 5\' 3"  (1.6 m)  Wt 150 lb (68.04 kg)  BMI 26.57 kg/m2  SpO2 99%  LMP 08/12/2011  Physical Exam  Nursing note and vitals reviewed. Constitutional: She is oriented to person, place, and time. She appears well-developed and well-nourished.  Non-toxic appearance.  HENT:  Head: Normocephalic.  Right Ear: Tympanic membrane and external ear normal.  Left Ear: Tympanic membrane and external ear normal.       There is mild increased redness of the posterior  cervix, with mild swelling of the uvula. The uvula is in the midline. The speech is understandable.  Eyes: EOM and lids are normal. Pupils are equal, round, and reactive to light.  Neck: Carotid bruit is not present.       The patient complains of pain with even light touch of the neck. Any attempt to move the neck causes severe pain. The trachea is in the midline. There is no bruit appreciated.  Cardiovascular: Regular rhythm, normal heart sounds, intact distal pulses and normal pulses.  Tachycardia present.   Pulmonary/Chest: Breath sounds normal. No  respiratory distress.  Abdominal: Soft. Bowel sounds are normal. There is no tenderness. There is no guarding.  Musculoskeletal: Normal range of motion.  Lymphadenopathy:       Head (right side): No submandibular adenopathy present.       Head (left side): No submandibular adenopathy present.  Neurological: She is alert and oriented to person, place, and time. She has normal strength. No cranial nerve deficit or sensory deficit.  Skin: Skin is warm and dry. No rash noted.  Psychiatric: She has a normal mood and affect. Her speech is normal.    ED Course  Procedures (including critical care time)   Labs Reviewed  RAPID STREP SCREEN  CBC  DIFFERENTIAL  BASIC METABOLIC PANEL   No results found.   No diagnosis found.    MDM  I have reviewed nursing notes, vital signs, and all appropriate lab and imaging results for this patient. Examination as noted above reveals pain with even light touch of the neck, and even more pain with attempted range of motion of the neck. The temperature was low-grade at 99.6, pulse rate was elevated at 102. Strep screen was read as negative.  The patient was seen was made by Dr.Cook.  The patient moved to the acute care area where the patient will receive IV fluids, IV pain medications, and blood work. The patient's care will be continued by Dr. Adriana Simas (4:53 PM).       Kathie Dike, [place] 09/13/11 2105

## 2011-09-16 NOTE — ED Provider Notes (Signed)
Medical screening examination/treatment/procedure(s) were conducted as a shared visit with non-physician practitioner(s) and myself.  I personally evaluated the patient during the encounter.  No meningeal signs. Oral pharyngeal area is erythematous. Will hydrate, treat pain.  Donnetta Hutching, MD 09/16/11 1622

## 2011-11-20 ENCOUNTER — Encounter (HOSPITAL_COMMUNITY): Payer: Self-pay | Admitting: Emergency Medicine

## 2011-11-20 ENCOUNTER — Emergency Department (HOSPITAL_COMMUNITY): Payer: Self-pay

## 2011-11-20 ENCOUNTER — Emergency Department (HOSPITAL_COMMUNITY)
Admission: EM | Admit: 2011-11-20 | Discharge: 2011-11-20 | Disposition: A | Discharge status: Home / Self Care | Payer: Self-pay | Attending: Emergency Medicine | Admitting: Emergency Medicine

## 2011-11-20 DIAGNOSIS — J40 Bronchitis, not specified as acute or chronic: Secondary | ICD-10-CM | POA: Insufficient documentation

## 2011-11-20 MED ORDER — PROMETHAZINE-CODEINE 6.25-10 MG/5ML PO SYRP: 5.0000 mL | ORAL_SOLUTION | ORAL | Status: AC | PRN

## 2011-11-20 MED ORDER — DEXAMETHASONE 4 MG PO TABS: ORAL_TABLET | ORAL | Status: DC

## 2011-11-20 MED ORDER — ALBUTEROL SULFATE HFA 108 (90 BASE) MCG/ACT IN AERS: 2.0000 | INHALATION_SPRAY | RESPIRATORY_TRACT | Status: DC

## 2011-11-20 MED ORDER — PREDNISONE 20 MG PO TABS
60.0000 mg | ORAL_TABLET | Freq: Once | ORAL | Status: AC
  Administered 2011-11-20: 60 mg via ORAL
  Filled 2011-11-20: qty 3

## 2011-11-20 NOTE — ED Notes (Signed)
Awaiting RT for INH tx. Pt to be discharged after treatment.

## 2011-11-20 NOTE — ED Notes (Signed)
Pt c/o cough/congestion x 3 weeks-productive brown sputum.

## 2011-11-20 NOTE — ED Notes (Signed)
Pt. Relates hacking cough x 3 weeks. Early on associated w/low grade fever.  She has tried her mother's albuterol neb tx and unknown inhaler.  Also ibuprofen, mucinex w/out relief.  When she does get something up, it is very thick, tan, minimal amount.

## 2011-11-20 NOTE — ED Notes (Signed)
Pt left facility without waiting for RT. Did not wait for inhaler. Pt was supposed to be checking on her ride but did not come back.

## 2011-11-20 NOTE — Discharge Instructions (Signed)
Bronchitis Bronchitis is a problem of the air tubes leading to your lungs. This problem makes it hard for air to get in and out of the lungs. You may cough a lot because your air tubes are narrow. Going without care can cause lasting (chronic) bronchitis. HOME CARE   Drink enough fluids to keep your pee (urine) clear or pale yellow.   Use a cool mist humidifier.   Quit smoking if you smoke. If you keep smoking, the bronchitis might not get better.   Only take medicine as told by your doctor.  GET HELP RIGHT AWAY IF:   Coughing keeps you awake.   You start to wheeze.   You become more sick or weak.   You have a hard time breathing or get short of breath.   You cough up blood.   Coughing lasts more than 2 weeks.   You have a fever.   Your baby is older than 3 months with a rectal temperature of 102 F (38.9 C) or higher.   Your baby is 3 months old or younger with a rectal temperature of 100.4 F (38 C) or higher.  MAKE SURE YOU:  Understand these instructions.   Will watch your condition.   Will get help right away if you are not doing well or get worse.  Document Released: 10/25/2007 Document Revised: 04/27/2011 Document Reviewed: 04/09/2009 [employer] Patient Information 2012 [employer]. 

## 2011-11-20 NOTE — ED Provider Notes (Signed)
History     CSN: [id number]  Arrival date & time 11/20/11  1006   First MD Initiated Contact with Patient 11/20/11 1129      Chief Complaint  Patient presents with  . Cough    (Consider location/radiation/quality/duration/timing/severity/associated sxs/prior treatment) HPI Comments: Pt presents with 3 weeks of cough. Mostly nonproductive. Denies blood present. No high fever.  Patient is a 31 y.o. female presenting with cough. The history is provided by the patient.  Cough This is a new problem. The current episode started more than 1 week ago. The problem occurs every few minutes. The problem has been gradually worsening. The cough is productive of sputum. There has been no fever. Associated symptoms include chest pain, chills, myalgias and wheezing. Pertinent negatives include no shortness of breath. She has tried decongestants for the symptoms. The treatment provided no relief. She is a smoker. Her past medical history is significant for bronchitis.    History reviewed. No pertinent past medical history.  Past Surgical History  Procedure Date  . Tonsillectomy     No family history on file.  History  Substance Use Topics  . Smoking status: Current Everyday Smoker -- 1.0 packs/day    Types: Cigarettes  . Smokeless tobacco: Not on file  . Alcohol Use: Yes     occasionally    OB History    Grav Para Term Preterm Abortions TAB SAB Ect Mult Living   5 2   1            Review of Systems  Constitutional: Positive for chills. Negative for activity change.       All ROS Neg except as noted in HPI  HENT: Negative for nosebleeds and neck pain.   Eyes: Negative for photophobia and discharge.  Respiratory: Positive for cough and wheezing. Negative for shortness of breath.   Cardiovascular: Positive for chest pain. Negative for palpitations.  Gastrointestinal: Negative for abdominal pain and blood in stool.  Genitourinary: Negative for dysuria, frequency and hematuria.    Musculoskeletal: Positive for myalgias. Negative for back pain and arthralgias.  Skin: Negative.   Neurological: Negative for dizziness, seizures and speech difficulty.  Psychiatric/Behavioral: Negative for hallucinations and confusion.    Allergies  Tramadol  Home Medications   Current Outpatient Rx  Name Route Sig Dispense Refill  . DEXAMETHASONE 4 MG PO TABS  2 tablets day one. Then 1 tablet daily with food. 8 tablet 0  . PROMETHAZINE-CODEINE 6.25-10 MG/5ML PO SYRP Oral Take 5 mLs by mouth every 4 (four) hours as needed for cough. 120 mL 0    BP 118/67  Pulse 75  Temp 98.6 F (37 C)  Resp 18  Ht 5\' 3"  (1.6 m)  Wt 165 lb (74.844 kg)  BMI 29.23 kg/m2  SpO2 100%  LMP 11/20/2011  Physical Exam  Nursing note and vitals reviewed. Constitutional: She is oriented to person, place, and time. She appears well-developed and well-nourished.  Non-toxic appearance.  HENT:  Head: Normocephalic.  Right Ear: Tympanic membrane and external ear normal.  Left Ear: Tympanic membrane and external ear normal.       Mild nasal congestion.  Eyes: EOM and lids are normal. Pupils are equal, round, and reactive to light.  Neck: Normal range of motion. Neck supple. Carotid bruit is not present.  Cardiovascular: Normal rate, regular rhythm, normal heart sounds, intact distal pulses and normal pulses.   Pulmonary/Chest: No respiratory distress. She has wheezes. She has rhonchi.       Few  scattered wheezes present.  Abdominal: Soft. Bowel sounds are normal. There is no tenderness. There is no guarding.  Musculoskeletal: Normal range of motion.  Lymphadenopathy:       Head (right side): No submandibular adenopathy present.       Head (left side): No submandibular adenopathy present.    She has no cervical adenopathy.  Neurological: She is alert and oriented to person, place, and time. She has normal strength. No cranial nerve deficit or sensory deficit.  Skin: Skin is warm and dry.  Psychiatric:  She has a normal mood and affect. Her speech is normal.    ED Course  Procedures (including critical care time)  Labs Reviewed - No data to display Dg Chest 2 View  11/20/2011  *RADIOLOGY REPORT*  Clinical Data: Chest pain, smoker, cough  CHEST - 2 VIEW  Comparison: None  Findings: Normal heart size, mediastinal contours, and pulmonary vascularity. Lungs clear. No pleural effusion or pneumothorax. Bones unremarkable.  IMPRESSION: No acute abnormalities.  Original Report Authenticated By: Lollie Marrow, M.D.     1. Bronchitis       MDM  I have reviewed nursing notes, vital signs, and all appropriate lab and imaging results for this patient. Vital signs stable. No hx of hemoptosis. CXRay negative. Rx for Decadron and promethazine cough med given to the patient.       Kathie Dike, [place] 11/23/11 1224

## 2011-11-27 NOTE — ED Provider Notes (Signed)
Medical screening examination/treatment/procedure(s) were performed by non-physician practitioner and as supervising physician I was immediately available for consultation/collaboration.  Geoffery Lyons, MD 11/27/11 [phone]

## 2011-12-01 ENCOUNTER — Emergency Department (HOSPITAL_COMMUNITY)
Admission: EM | Admit: 2011-12-01 | Discharge: 2011-12-02 | Discharge status: Against Medical Advice | Payer: Self-pay | Attending: Emergency Medicine | Admitting: Emergency Medicine

## 2011-12-01 ENCOUNTER — Encounter (HOSPITAL_COMMUNITY): Payer: Self-pay

## 2011-12-01 DIAGNOSIS — M2569 Stiffness of other specified joint, not elsewhere classified: Secondary | ICD-10-CM | POA: Insufficient documentation

## 2011-12-01 DIAGNOSIS — F17200 Nicotine dependence, unspecified, uncomplicated: Secondary | ICD-10-CM | POA: Insufficient documentation

## 2011-12-01 DIAGNOSIS — R319 Hematuria, unspecified: Secondary | ICD-10-CM | POA: Insufficient documentation

## 2011-12-01 DIAGNOSIS — R630 Anorexia: Secondary | ICD-10-CM | POA: Insufficient documentation

## 2011-12-01 DIAGNOSIS — R300 Dysuria: Secondary | ICD-10-CM | POA: Insufficient documentation

## 2011-12-01 DIAGNOSIS — R350 Frequency of micturition: Secondary | ICD-10-CM | POA: Insufficient documentation

## 2011-12-01 DIAGNOSIS — F1120 Opioid dependence, uncomplicated: Secondary | ICD-10-CM | POA: Insufficient documentation

## 2011-12-01 DIAGNOSIS — R197 Diarrhea, unspecified: Secondary | ICD-10-CM | POA: Insufficient documentation

## 2011-12-01 DIAGNOSIS — IMO0001 Reserved for inherently not codable concepts without codable children: Secondary | ICD-10-CM | POA: Insufficient documentation

## 2011-12-01 HISTORY — DX: Other psychoactive substance abuse, uncomplicated: F19.10

## 2011-12-01 LAB — RAPID URINE DRUG SCREEN, HOSP PERFORMED
Benzodiazepines: NOT DETECTED
Cocaine: NOT DETECTED
Opiates: POSITIVE — AB

## 2011-12-01 LAB — CBC WITH DIFFERENTIAL/PLATELET
Eosinophils Absolute: 0.3 10*3/uL (ref 0.0–0.7)
Eosinophils Relative: 3 % (ref 0–5)
Hemoglobin: 14.2 g/dL (ref 12.0–15.0)
Lymphs Abs: 2.2 10*3/uL (ref 0.7–4.0)
MCH: 30.8 pg (ref 26.0–34.0)
MCV: 90.5 fL (ref 78.0–100.0)
Monocytes Absolute: 0.7 10*3/uL (ref 0.1–1.0)
Monocytes Relative: 7 % (ref 3–12)
RBC: 4.61 MIL/uL (ref 3.87–5.11)

## 2011-12-01 LAB — URINALYSIS, ROUTINE W REFLEX MICROSCOPIC
Glucose, UA: NEGATIVE mg/dL
Ketones, ur: 40 mg/dL — AB
Leukocytes, UA: NEGATIVE
Protein, ur: NEGATIVE mg/dL

## 2011-12-01 LAB — COMPREHENSIVE METABOLIC PANEL
Alkaline Phosphatase: 46 U/L (ref 39–117)
BUN: 9 mg/dL (ref 6–23)
Calcium: 9.9 mg/dL (ref 8.4–10.5)
Creatinine, Ser: 0.53 mg/dL (ref 0.50–1.10)
GFR calc Af Amer: >90 mL/min (ref >90)
Glucose, Bld: 121 mg/dL — ABNORMAL HIGH (ref 70–99)
Total Protein: 7.3 g/dL (ref 6.0–8.3)

## 2011-12-01 LAB — SALICYLATE LEVEL: Salicylate Lvl: <2 mg/dL — ABNORMAL LOW (ref 2.8–20.0)

## 2011-12-01 LAB — ETHANOL: Alcohol, Ethyl (B): <11 mg/dL (ref 0–11)

## 2011-12-01 LAB — URINE MICROSCOPIC-ADD ON

## 2011-12-01 MED ORDER — CLONIDINE HCL 0.1 MG PO TABS: 0.1000 mg | ORAL_TABLET | ORAL | Status: DC

## 2011-12-01 MED ORDER — LORAZEPAM 1 MG PO TABS
1.0000 mg | ORAL_TABLET | Freq: Three times a day (TID) | ORAL | Status: DC | PRN
  Administered 2011-12-01: 1 mg via ORAL
  Filled 2011-12-01: qty 1

## 2011-12-01 MED ORDER — CLONIDINE HCL 0.1 MG PO TABS
0.1000 mg | ORAL_TABLET | Freq: Four times a day (QID) | ORAL | Status: DC
  Administered 2011-12-01: 0.1 mg via ORAL
  Filled 2011-12-01: qty 1

## 2011-12-01 MED ORDER — DICYCLOMINE HCL 10 MG PO CAPS: 20.0000 mg | ORAL_CAPSULE | Freq: Four times a day (QID) | ORAL | Status: DC | PRN

## 2011-12-01 MED ORDER — METHOCARBAMOL 500 MG PO TABS: 500.0000 mg | ORAL_TABLET | Freq: Three times a day (TID) | ORAL | Status: DC | PRN

## 2011-12-01 MED ORDER — ACETAMINOPHEN 325 MG PO TABS: 650.0000 mg | ORAL_TABLET | ORAL | Status: DC | PRN

## 2011-12-01 MED ORDER — ZOLPIDEM TARTRATE 5 MG PO TABS: 10.0000 mg | ORAL_TABLET | Freq: Every evening | ORAL | Status: DC | PRN

## 2011-12-01 MED ORDER — CLONIDINE HCL 0.1 MG PO TABS: 0.1000 mg | ORAL_TABLET | Freq: Every day | ORAL | Status: DC

## 2011-12-01 MED ORDER — NICOTINE 21 MG/24HR TD PT24
21.0000 mg | MEDICATED_PATCH | Freq: Every day | TRANSDERMAL | Status: DC
  Administered 2011-12-01: 21 mg via TRANSDERMAL
  Filled 2011-12-01: qty 1

## 2011-12-01 MED ORDER — HYDROXYZINE HCL 25 MG PO TABS
25.0000 mg | ORAL_TABLET | Freq: Four times a day (QID) | ORAL | Status: DC | PRN
Start: 2011-12-01 — End: 2011-12-02

## 2011-12-01 MED ORDER — ONDANSETRON 4 MG PO TBDP: 4.0000 mg | ORAL_TABLET | Freq: Four times a day (QID) | ORAL | Status: DC | PRN

## 2011-12-01 MED ORDER — NAPROXEN 250 MG PO TABS: 500.0000 mg | ORAL_TABLET | Freq: Two times a day (BID) | ORAL | Status: DC | PRN

## 2011-12-01 MED ORDER — POTASSIUM CHLORIDE CRYS ER 20 MEQ PO TBCR
40.0000 meq | EXTENDED_RELEASE_TABLET | Freq: Two times a day (BID) | ORAL | Status: DC
  Administered 2011-12-01: 40 meq via ORAL
  Filled 2011-12-01: qty 2

## 2011-12-01 MED ORDER — LOPERAMIDE HCL 2 MG PO CAPS: 2.0000 mg | ORAL_CAPSULE | ORAL | Status: DC | PRN

## 2011-12-01 MED ORDER — ZOLPIDEM TARTRATE 5 MG PO TABS: 5.0000 mg | ORAL_TABLET | Freq: Every evening | ORAL | Status: DC | PRN

## 2011-12-01 MED ORDER — IBUPROFEN 400 MG PO TABS: 600.0000 mg | ORAL_TABLET | Freq: Three times a day (TID) | ORAL | Status: DC | PRN

## 2011-12-01 MED ORDER — ALUM & MAG HYDROXIDE-SIMETH 200-200-20 MG/5ML PO SUSP: 30.0000 mL | ORAL | Status: DC | PRN

## 2011-12-01 NOTE — ED Notes (Signed)
Pt very tearful.  States that she is ready to get some help with her narcotics abuse.  Pt reports that she has tried to stop herself but is unable to.

## 2011-12-01 NOTE — BH Assessment (Signed)
[facility] Assessment Progress Note      Patient accepted to [facility]. Contacted [facility], spoke with Marylene Land, who authorized 3 days, authorization number [id number].

## 2011-12-01 NOTE — ED Notes (Signed)
Pt denied being here with another pt and was upset that they were being sent to different behavioral health hospitals. Pt states the only ride she has was with the man she came with.

## 2011-12-01 NOTE — BH Assessment (Signed)
Assessment Note   Marisa Kirby is an 31 y.o. female. Patient states that her friend's mother brought her to the emergency room because she wants to get help "getting off pain pills." Patient reports that she takes up to 2 60 mg morphine pills per day. She states her last use was last night, and she used 2, 30 mg morphine pills. She states some times she will use vicotin 5's or percocet 10's but only when she can't get what she needs. Patient states she will use xanax on occasion; she is not sure when the last time she used xanax. She used THC last week, one joint, and also uses that on occasion. She states she takes no regular medications. She is not suicidal or homicidal. No delusions or hallucinations noted. She denies any criminal justice or other type of issues. She states essentially she decided to get help when the person's mother who gave her the pills last night told her the least she could have done was wash the dishes. She said that is when she realized she needed to get help. Patient denies any further issues.   Axis I: Substance Abuse; Opioid Dependence; Cannabis Abuse Axis II: Deferred Axis III: Tonsillectomy Axis IV: social impairments with friends and family; financial stressors Axis V: GAF 64  Past Medical History:  Past Medical History  Diagnosis Date  . Substance abuse     Past Surgical History  Procedure Date  . Tonsillectomy     Family History: No family history on file.  Social History:  reports that she has been smoking Cigarettes.  She has been smoking about 1 pack per day. She does not have any smokeless tobacco history on file. She reports that she drinks alcohol. She reports that she uses illicit drugs.  Additional Social History:     CIWA: CIWA-Ar BP: 123/82 mmHg Pulse Rate: 82  COWS:    Allergies:  Allergies  Allergen Reactions  . Tramadol Hives    Home Medications:  (Not in a hospital admission)  OB/GYN Status:  Patient's last menstrual period  was 11/20/2011.  General Assessment Data Location of Assessment: [facility] ACT Assessment: Yes Living Arrangements: Other relatives Can pt return to current living arrangement?: Yes Admission Status: Voluntary Is patient capable of signing voluntary admission?: Yes Transfer from: Acute Hospital Referral Source: MD  Education Status Is patient currently in school?: No  Risk to self Suicidal Ideation: No Suicidal Intent: No Is patient at risk for suicide?: No Suicidal Plan?: No Access to Means: No What has been your use of drugs/alcohol within the last 12 months?:  (daily) Previous Attempts/Gestures: No How many times?:  (denies) Other Self Harm Risks:  (none) Triggers for Past Attempts: None known Intentional Self Injurious Behavior: None Family Suicide History: No Recent stressful life event(s): Conflict (Comment) Persecutory voices/beliefs?: No Depression: No Depression Symptoms: Loss of interest in usual pleasures Substance abuse history and/or treatment for substance abuse?: No Suicide prevention information given to non-admitted patients: Yes  Risk to Others Homicidal Ideation: No Thoughts of Harm to Others: No Current Homicidal Intent: No Current Homicidal Plan: No Access to Homicidal Means: No History of harm to others?: No Assessment of Violence: None Noted Violent Behavior Description:  (calm) Does patient have access to weapons?: No Criminal Charges Pending?: No Does patient have a court date: No  Psychosis Hallucinations: None noted Delusions: None noted  Mental Status Report Appear/Hygiene: Disheveled Eye Contact: Fair Motor Activity: Freedom of movement Speech: Logical/coherent Level of Consciousness:  Alert Mood: Anxious;Helpless;Worthless, low self-esteem Affect: Anxious;Appropriate to circumstance Anxiety Level: Minimal Thought Processes: Relevant Judgement: Unimpaired Orientation: Person;Place;Time;Situation;Appropriate for developmental  age Obsessive Compulsive Thoughts/Behaviors: Minimal  Cognitive Functioning Concentration: Decreased Memory: Recent Intact;Remote Intact IQ: Average Insight: Fair Impulse Control: Fair Appetite: Fair Sleep: Decreased Total Hours of Sleep:  (5) Vegetative Symptoms: None  ADLScreening [facility] Assessment Services) Patient's cognitive ability adequate to safely complete daily activities?: Yes Patient able to express need for assistance with ADLs?: Yes Independently performs ADLs?: Yes  Abuse/Neglect [employer]) Physical Abuse: Denies Verbal Abuse: Denies Sexual Abuse: Denies  Prior Inpatient Therapy Prior Inpatient Therapy: No  Prior Outpatient Therapy Prior Outpatient Therapy: No  ADL Screening (condition at time of admission) Patient's cognitive ability adequate to safely complete daily activities?: Yes Patient able to express need for assistance with ADLs?: Yes Independently performs ADLs?: Yes       Abuse/Neglect Assessment (Assessment to be complete while patient is alone) Physical Abuse: Denies Verbal Abuse: Denies Sexual Abuse: Denies Values / Beliefs Cultural Requests During Hospitalization: None Spiritual Requests During Hospitalization: None        Additional Information 1:1 In Past 12 Months?: No CIRT Risk: No Elopement Risk: No Does patient have medical clearance?: Yes     Disposition:  Disposition Disposition of Patient: Inpatient treatment program Type of inpatient treatment program: Adult  On Site Evaluation by:  Dr. Lynelle Doctor Reviewed with Physician:  Dr. Lynelle Doctor  Patient referred to [facility].   Shon Baton H 12/01/2011 7:46 PM

## 2011-12-01 NOTE — ED Provider Notes (Signed)
History   This chart was scribed for Ward Givens, MD by Sofie Rower. The patient was seen in room [facility] and the patient's care was started at 4:11 PM     CSN: [id number]  Arrival date & time 12/01/11  1539   First MD Initiated Contact with Patient 12/01/11 1551      Chief Complaint  Patient presents with  . Addiction Problem    (Consider location/radiation/quality/duration/timing/severity/associated sxs/prior treatment) HPI  Marisa Kirby is a 31 y.o. female who presents to the Emergency Department complaining of addiction to pain pills onset 6 years She also complains  of loss of appetite, increased urinary frequency, hematuria, dysuria for the past couple of days. The pt informs the EDP that she has been taking 60 mg of morphine a day and if she can't get morphine she will take oxycodone or hydrocodone. Pt reports she has tried to quit on her own unsuccessfully, the longest she was able to quit was 3 days. She reports she gets severe diarrhea, loss of appetite and body stiffness/pains. Pt informs the EDP that she started taking pain pills by obtaining them from her boyfriends mother. Pt reports that she was also smoking pot when she first began to use pain pills. Pt informs the EDP that she had been stealing from her family and adoption parents to pay for her addiction. The last time the pt took any pain pills was yesterday evening. Pt has a hx of substance abuse.   Pt denies attending any rehab. She denies depression, SI or HI associated with the addiction.    Pt does not have a PCP.    Past Medical History  Diagnosis Date  . Substance abuse     Past Surgical History  Procedure Date  . Tonsillectomy     No family history on file. Adopted, is in contact with birth mother who is an addict  History  Substance Use Topics  . Smoking status: Current Everyday Smoker -- 1.0 packs/day    Types: Cigarettes  . Smokeless tobacco: Not on file  . Alcohol Use: Yes   occasionally   Pt is a smoker.  Pt drinks every 3-4 months.  Pt is unemployed.  Narcotic abuse   OB History    Grav Para Term Preterm Abortions TAB SAB Ect Mult Living   5 2   1            Review of Systems  All other systems reviewed and are negative.    10 Systems reviewed and all are negative for acute change except as noted in the HPI.    Allergies  Tramadol  Home Medications   None  BP 136/80  Pulse 112  Temp 98.4 F (36.9 C) (Oral)  Resp 22  Ht 5\' 3"  (1.6 m)  Wt 165 lb (74.844 kg)  BMI 29.23 kg/m2  SpO2 98%  LMP 11/20/2011  Laboratory interpretation all normal except tachycardia   Physical Exam  Nursing note and vitals reviewed. Constitutional: She is oriented to person, place, and time. She appears well-developed and well-nourished.       Tearful.   HENT:  Head: Normocephalic and atraumatic.  Right Ear: External ear normal.  Left Ear: External ear normal.  Nose: Nose normal.       Tongue dry.   Eyes: Conjunctivae and EOM are normal. Pupils are equal, round, and reactive to light.       Pupils dilated.   Neck: Normal range of motion. Neck supple.  Cardiovascular:  Normal rate, regular rhythm and normal heart sounds.   Pulmonary/Chest: Effort normal and breath sounds normal.  Abdominal: Soft. Bowel sounds are normal.  Musculoskeletal: Normal range of motion. She exhibits no edema and no tenderness.  Neurological: She is alert and oriented to person, place, and time.  Skin: Skin is warm and dry.  Psychiatric: Her behavior is normal. Thought content normal.       tearful    ED Course  Procedures (including critical care time)   Pt started on the clonidine withdrawal protocol  4:20PM- EDP at bedside discusses treatment plan concerning urine sample, blood work, mental health consultation.   18:45 Stephan Minister, ACT here  [facility] arranged admission to [facility], but patient was in ED with another patient requesting detox and she was upset they weren't  going to the same facility and left AMA   Results for orders placed during the hospital encounter of 12/01/11  CBC WITH DIFFERENTIAL      Component Value Range   WBC 9.5  4.0 - 10.5 K/uL   RBC 4.61  3.87 - 5.11 MIL/uL   Hemoglobin 14.2  12.0 - 15.0 g/dL   HCT 84.6  96.2 - 95.2 %   MCV 90.5  78.0 - 100.0 fL   MCH 30.8  26.0 - 34.0 pg   MCHC 34.1  30.0 - 36.0 g/dL   RDW 84.1  32.4 - 40.1 %   Platelets 333  150 - 400 K/uL   Neutrophils Relative 67  43 - 77 %   Neutro Abs 6.4  1.7 - 7.7 K/uL   Lymphocytes Relative 23  12 - 46 %   Lymphs Abs 2.2  0.7 - 4.0 K/uL   Monocytes Relative 7  3 - 12 %   Monocytes Absolute 0.7  0.1 - 1.0 K/uL   Eosinophils Relative 3  0 - 5 %   Eosinophils Absolute 0.3  0.0 - 0.7 K/uL   Basophils Relative 1  0 - 1 %   Basophils Absolute 0.1  0.0 - 0.1 K/uL  COMPREHENSIVE METABOLIC PANEL      Component Value Range   Sodium 141  135 - 145 mEq/L   Potassium 3.1 (*) 3.5 - 5.1 mEq/L   Chloride 107  96 - 112 mEq/L   CO2 24  19 - 32 mEq/L   Glucose, Bld 121 (*) 70 - 99 mg/dL   BUN 9  6 - 23 mg/dL   Creatinine, Ser 0.27  0.50 - 1.10 mg/dL   Calcium 9.9  8.4 - 25.3 mg/dL   Total Protein 7.3  6.0 - 8.3 g/dL   Albumin 4.1  3.5 - 5.2 g/dL   AST 15  0 - 37 U/L   ALT 12  0 - 35 U/L   Alkaline Phosphatase 46  39 - 117 U/L   Total Bilirubin 0.3  0.3 - 1.2 mg/dL   GFR calc non Af Amer >90  >90 mL/min   GFR calc Af Amer >90  >90 mL/min  ETHANOL      Component Value Range   Alcohol, Ethyl (B) <11  0 - 11 mg/dL  ACETAMINOPHEN LEVEL      Component Value Range   Acetaminophen (Tylenol), Serum <15.0  10 - 30 ug/mL  SALICYLATE LEVEL      Component Value Range   Salicylate Lvl <2.0 (*) 2.8 - 20.0 mg/dL  URINALYSIS, ROUTINE W REFLEX MICROSCOPIC      Component Value Range   Color, Urine YELLOW  YELLOW  APPearance HAZY (*) CLEAR   Specific Gravity, Urine >1.030 (*) 1.005 - 1.030   pH 6.0  5.0 - 8.0   Glucose, UA NEGATIVE  NEGATIVE mg/dL   Hgb urine dipstick  MODERATE (*) NEGATIVE   Bilirubin Urine SMALL (*) NEGATIVE   Ketones, ur 40 (*) NEGATIVE mg/dL   Protein, ur NEGATIVE  NEGATIVE mg/dL   Urobilinogen, UA 0.2  0.0 - 1.0 mg/dL   Nitrite NEGATIVE  NEGATIVE   Leukocytes, UA NEGATIVE  NEGATIVE  POCT PREGNANCY, URINE      Component Value Range   Preg Test, Ur NEGATIVE  NEGATIVE  URINE MICROSCOPIC-ADD ON      Component Value Range   Squamous Epithelial / LPF FEW (*) RARE   WBC, UA 11-20  <3 WBC/hpf   RBC / HPF 11-20  <3 RBC/hpf   Bacteria, UA FEW (*) RARE   Crystals CA OXALATE CRYSTALS (*) NEGATIVE  URINE RAPID DRUG SCREEN (HOSP PERFORMED)      Component Value Range   Opiates POSITIVE (*) NONE DETECTED   Cocaine NONE DETECTED  NONE DETECTED   Benzodiazepines NONE DETECTED  NONE DETECTED   Amphetamines POSITIVE (*) NONE DETECTED   Tetrahydrocannabinol POSITIVE (*) NONE DETECTED   Barbiturates NONE DETECTED  NONE DETECTED   Laboratory interpretation all normal except hypokalemia   1. Narcotic addiction     Pt left AMA   Devoria Albe, MD, FACEP   MDM   I personally performed the services described in this documentation, which was scribed in my presence. The recorded information has been reviewed and considered.  Devoria Albe, MD, Armando Gang    Ward Givens, MD 12/02/11 [phone]

## 2011-12-01 NOTE — ED Notes (Signed)
Pt reports has problems with abusing pain pills.  Pt says has tried to stop on her own but she always gets sick.  Denies any SI or HI.  Reports has noticed some blood in her urine x 2 or 3 days.  Also has had frequent urge to urinate.

## 2011-12-02 ENCOUNTER — Emergency Department: Payer: Self-pay | Admitting: Emergency Medicine

## 2011-12-02 LAB — CBC
MCH: 29.9 pg (ref 26.0–34.0)
Platelet: 300 10*3/uL (ref 150–440)
RBC: 4.4 10*6/uL (ref 3.80–5.20)
WBC: 10.2 10*3/uL (ref 3.6–11.0)

## 2011-12-02 LAB — COMPREHENSIVE METABOLIC PANEL
Albumin: 3.7 g/dL (ref 3.4–5.0)
Alkaline Phosphatase: 51 U/L (ref 50–136)
Anion Gap: 7 (ref 7–16)
Bilirubin,Total: 0.2 mg/dL (ref 0.2–1.0)
Calcium, Total: 8.8 mg/dL (ref 8.5–10.1)
Creatinine: 0.82 mg/dL (ref 0.60–1.30)
Glucose: 123 mg/dL — ABNORMAL HIGH (ref 65–99)
Osmolality: 288 (ref 275–301)
Potassium: 3.5 mmol/L (ref 3.5–5.1)
Sodium: 144 mmol/L (ref 136–145)
Total Protein: 7.4 g/dL (ref 6.4–8.2)

## 2011-12-02 LAB — ETHANOL: Ethanol: <3 mg/dL

## 2011-12-02 LAB — URINALYSIS, COMPLETE
Bilirubin,UR: NEGATIVE
Ph: 5 (ref 4.5–8.0)
Specific Gravity: 1.032 (ref 1.003–1.030)
Squamous Epithelial: 3

## 2011-12-02 LAB — DRUG SCREEN, URINE
Barbiturates, Ur Screen: NEGATIVE (ref ?–200)
Benzodiazepine, Ur Scrn: NEGATIVE (ref ?–200)
Cannabinoid 50 Ng, Ur ~~LOC~~: POSITIVE (ref ?–50)

## 2012-09-30 ENCOUNTER — Encounter (HOSPITAL_COMMUNITY): Payer: Self-pay | Admitting: *Deleted

## 2012-09-30 ENCOUNTER — Emergency Department (HOSPITAL_COMMUNITY)
Admission: EM | Admit: 2012-09-30 | Discharge: 2012-09-30 | Discharge status: Against Medical Advice | Payer: Self-pay | Attending: Emergency Medicine | Admitting: Emergency Medicine

## 2012-09-30 DIAGNOSIS — N200 Calculus of kidney: Secondary | ICD-10-CM | POA: Insufficient documentation

## 2012-09-30 DIAGNOSIS — F17200 Nicotine dependence, unspecified, uncomplicated: Secondary | ICD-10-CM | POA: Insufficient documentation

## 2012-09-30 HISTORY — DX: Calculus of kidney: N20.0

## 2012-09-30 NOTE — ED Notes (Signed)
States she was diagnosed with a kidney stone last month and is having severe pain

## 2012-09-30 NOTE — ED Notes (Signed)
Called for bed assignment, no answer.

## 2012-09-30 NOTE — ED Notes (Signed)
Called for room assignment, no answer °

## 2014-03-23 ENCOUNTER — Encounter (HOSPITAL_COMMUNITY): Payer: Self-pay | Admitting: *Deleted

## 2015-09-16 DIAGNOSIS — N201 Calculus of ureter: Secondary | ICD-10-CM | POA: Insufficient documentation

## 2015-11-04 ENCOUNTER — Encounter (HOSPITAL_COMMUNITY): Payer: Self-pay | Admitting: Emergency Medicine

## 2015-11-04 ENCOUNTER — Emergency Department (HOSPITAL_COMMUNITY): Payer: Self-pay

## 2015-11-04 ENCOUNTER — Emergency Department (HOSPITAL_COMMUNITY)
Admission: EM | Admit: 2015-11-04 | Discharge: 2015-11-04 | Disposition: A | Discharge status: Home / Self Care | Payer: Self-pay | Attending: Emergency Medicine | Admitting: Emergency Medicine

## 2015-11-04 DIAGNOSIS — N23 Unspecified renal colic: Secondary | ICD-10-CM | POA: Insufficient documentation

## 2015-11-04 DIAGNOSIS — R109 Unspecified abdominal pain: Secondary | ICD-10-CM

## 2015-11-04 DIAGNOSIS — R102 Pelvic and perineal pain: Secondary | ICD-10-CM

## 2015-11-04 DIAGNOSIS — Z3490 Encounter for supervision of normal pregnancy, unspecified, unspecified trimester: Secondary | ICD-10-CM

## 2015-11-04 DIAGNOSIS — Z3A01 Less than 8 weeks gestation of pregnancy: Secondary | ICD-10-CM | POA: Insufficient documentation

## 2015-11-04 DIAGNOSIS — O2391 Unspecified genitourinary tract infection in pregnancy, first trimester: Secondary | ICD-10-CM | POA: Insufficient documentation

## 2015-11-04 DIAGNOSIS — Z87891 Personal history of nicotine dependence: Secondary | ICD-10-CM | POA: Insufficient documentation

## 2015-11-04 DIAGNOSIS — N390 Urinary tract infection, site not specified: Secondary | ICD-10-CM

## 2015-11-04 DIAGNOSIS — O23591 Infection of other part of genital tract in pregnancy, first trimester: Secondary | ICD-10-CM | POA: Insufficient documentation

## 2015-11-04 DIAGNOSIS — B9689 Other specified bacterial agents as the cause of diseases classified elsewhere: Secondary | ICD-10-CM

## 2015-11-04 DIAGNOSIS — O9989 Other specified diseases and conditions complicating pregnancy, childbirth and the puerperium: Secondary | ICD-10-CM | POA: Insufficient documentation

## 2015-11-04 DIAGNOSIS — O26899 Other specified pregnancy related conditions, unspecified trimester: Secondary | ICD-10-CM

## 2015-11-04 DIAGNOSIS — N760 Acute vaginitis: Secondary | ICD-10-CM

## 2015-11-04 LAB — HCG, QUANTITATIVE, PREGNANCY: HCG, BETA CHAIN, QUANT, S: 63 m[IU]/mL — AB (ref <5)

## 2015-11-04 LAB — CBC WITH DIFFERENTIAL/PLATELET
BASOS ABS: 0.1 10*3/uL (ref 0.0–0.1)
BASOS PCT: 1 %
EOS ABS: 0.1 10*3/uL (ref 0.0–0.7)
Eosinophils Relative: 1 %
HCT: 43.3 % (ref 36.0–46.0)
HEMOGLOBIN: 14.6 g/dL (ref 12.0–15.0)
Lymphocytes Relative: 26 %
Lymphs Abs: 3 10*3/uL (ref 0.7–4.0)
MCH: 30.8 pg (ref 26.0–34.0)
MCHC: 33.7 g/dL (ref 30.0–36.0)
MCV: 91.4 fL (ref 78.0–100.0)
MONO ABS: 0.8 10*3/uL (ref 0.1–1.0)
MONOS PCT: 7 %
NEUTROS ABS: 7.5 10*3/uL (ref 1.7–7.7)
NEUTROS PCT: 65 %
Platelets: 277 10*3/uL (ref 150–400)
RBC: 4.74 MIL/uL (ref 3.87–5.11)
RDW: 13.3 % (ref 11.5–15.5)
WBC: 11.5 10*3/uL — ABNORMAL HIGH (ref 4.0–10.5)

## 2015-11-04 LAB — URINE MICROSCOPIC-ADD ON

## 2015-11-04 LAB — BASIC METABOLIC PANEL
ANION GAP: 4 — AB (ref 5–15)
BUN: 10 mg/dL (ref 6–20)
CALCIUM: 9.4 mg/dL (ref 8.9–10.3)
CO2: 24 mmol/L (ref 22–32)
CREATININE: 0.52 mg/dL (ref 0.44–1.00)
Chloride: 107 mmol/L (ref 101–111)
GFR calc non Af Amer: >60 mL/min (ref >60)
Glucose, Bld: 101 mg/dL — ABNORMAL HIGH (ref 65–99)
Potassium: 3.6 mmol/L (ref 3.5–5.1)
SODIUM: 135 mmol/L (ref 135–145)

## 2015-11-04 LAB — WET PREP, GENITAL
Sperm: NONE SEEN
TRICH WET PREP: NONE SEEN
YEAST WET PREP: NONE SEEN

## 2015-11-04 LAB — URINALYSIS, ROUTINE W REFLEX MICROSCOPIC
Bilirubin Urine: NEGATIVE
GLUCOSE, UA: NEGATIVE mg/dL
Hgb urine dipstick: NEGATIVE
KETONES UR: NEGATIVE mg/dL
Nitrite: NEGATIVE
PROTEIN: NEGATIVE mg/dL
Specific Gravity, Urine: 1.015 (ref 1.005–1.030)
pH: 7.5 (ref 5.0–8.0)

## 2015-11-04 LAB — PREGNANCY, URINE: PREG TEST UR: POSITIVE — AB

## 2015-11-04 MED ORDER — HYDROCODONE-ACETAMINOPHEN 5-325 MG PO TABS
1.0000 | ORAL_TABLET | Freq: Once | ORAL | Status: AC
  Administered 2015-11-04: 1 via ORAL
  Filled 2015-11-04: qty 1

## 2015-11-04 MED ORDER — CEPHALEXIN 500 MG PO CAPS: 500.0000 mg | ORAL_CAPSULE | Freq: Four times a day (QID) | ORAL | Status: DC

## 2015-11-04 MED ORDER — METRONIDAZOLE 500 MG PO TABS: 500.0000 mg | ORAL_TABLET | Freq: Two times a day (BID) | ORAL | Status: DC

## 2015-11-04 MED ORDER — ONDANSETRON HCL 4 MG/2ML IJ SOLN
4.0000 mg | Freq: Once | INTRAMUSCULAR | Status: AC
  Administered 2015-11-04: 4 mg via INTRAVENOUS
  Filled 2015-11-04: qty 2

## 2015-11-04 MED ORDER — HYDROCODONE-ACETAMINOPHEN 5-325 MG PO TABS: 1.0000 | ORAL_TABLET | ORAL | Status: DC | PRN

## 2015-11-04 MED ORDER — MORPHINE SULFATE (PF) 4 MG/ML IV SOLN
4.0000 mg | Freq: Once | INTRAVENOUS | Status: AC
  Administered 2015-11-04: 4 mg via INTRAVENOUS
  Filled 2015-11-04: qty 1

## 2015-11-04 MED ORDER — DEXTROSE 5 % IV SOLN
1.0000 g | Freq: Once | INTRAVENOUS | Status: AC
  Administered 2015-11-04: 1 g via INTRAVENOUS
  Filled 2015-11-04: qty 10

## 2015-11-04 MED ORDER — PROMETHAZINE HCL 25 MG PO TABS: 25.0000 mg | ORAL_TABLET | Freq: Four times a day (QID) | ORAL | Status: DC | PRN

## 2015-11-04 MED ORDER — SODIUM CHLORIDE 0.9 % IV SOLN
Freq: Once | INTRAVENOUS | Status: AC
  Administered 2015-11-04: 12:00:00 via INTRAVENOUS

## 2015-11-04 NOTE — ED Notes (Signed)
Patient with lower abdominal and flank pain today. Recent stent placement after lithotripsy by Dr Derryl HarborBower approximately 1 month ago. Reports stent taken out about 3-4 weeks ago. C/o painful urination.

## 2015-11-04 NOTE — ED Notes (Signed)
Patient given discharge instruction, verbalized understand. IV removed, band aid applied. Patient wheelchair out of the department.  

## 2015-11-04 NOTE — Discharge Instructions (Signed)
Renal Colic Take antibiotic as prescribed. You need a repeat ultrasound in 1 week to assess your pregnancy. You also need a repeat pregnancy blood test. Return to the ED if you develop worsening pain, fever, vomiting or any other concerns. Renal colic is pain that is caused by passing a kidney stone. The pain can be sharp and severe. It may be felt in the back, abdomen, side (flank), or groin. It can cause nausea. Renal colic can come and go. HOME CARE INSTRUCTIONS Watch your condition for any changes. The following actions may help to lessen any discomfort that you are feeling:  Take medicines only as directed by your health care provider.  Ask your health care provider if it is okay to take over-the-counter pain medicine.  Drink enough fluid to keep your urine clear or pale yellow. Drink 6-8 glasses of water each day.  Limit the amount of salt that you eat to less than 2 grams per day.  Reduce the amount of protein in your diet. Eat less meat, fish, nuts, and dairy.  Avoid foods such as spinach, rhubarb, nuts, or bran. These may make kidney stones more likely to form. SEEK MEDICAL CARE IF:  You have a fever or chills.  Your urine smells bad or looks cloudy.  You have pain or burning when you pass urine. SEEK IMMEDIATE MEDICAL CARE IF:  Your flank pain or groin pain suddenly worsens.  You become confused or disoriented or you lose consciousness.   This information is not intended to replace advice given to you by your health care provider. Make sure you discuss any questions you have with your health care provider.   Document Released: 02/15/2005 Document Revised: 05/29/2014 Document Reviewed: 03/18/2014 Elsevier Interactive Patient Education 2016 Elsevier Inc.  Urinary Tract Infection Urinary tract infections (UTIs) can develop anywhere along your urinary tract. Your urinary tract is your body's drainage system for removing wastes and extra water. Your urinary tract includes  two kidneys, two ureters, a bladder, and a urethra. Your kidneys are a pair of bean-shaped organs. Each kidney is about the size of your fist. They are located below your ribs, one on each side of your spine. CAUSES Infections are caused by microbes, which are microscopic organisms, including fungi, viruses, and bacteria. These organisms are so small that they can only be seen through a microscope. Bacteria are the microbes that most commonly cause UTIs. SYMPTOMS  Symptoms of UTIs may vary by age and gender of the patient and by the location of the infection. Symptoms in young women typically include a frequent and intense urge to urinate and a painful, burning feeling in the bladder or urethra during urination. Older women and men are more likely to be tired, shaky, and weak and have muscle aches and abdominal pain. A fever may mean the infection is in your kidneys. Other symptoms of a kidney infection include pain in your back or sides below the ribs, nausea, and vomiting. DIAGNOSIS To diagnose a UTI, your caregiver will ask you about your symptoms. Your caregiver will also ask you to provide a urine sample. The urine sample will be tested for bacteria and white blood cells. White blood cells are made by your body to help fight infection. TREATMENT  Typically, UTIs can be treated with medication. Because most UTIs are caused by a bacterial infection, they usually can be treated with the use of antibiotics. The choice of antibiotic and length of treatment depend on your symptoms and the type of  bacteria causing your infection. HOME CARE INSTRUCTIONS  If you were prescribed antibiotics, take them exactly as your caregiver instructs you. Finish the medication even if you feel better after you have only taken some of the medication.  Drink enough water and fluids to keep your urine clear or pale yellow.  Avoid caffeine, tea, and carbonated beverages. They tend to irritate your bladder.  Empty your  bladder often. Avoid holding urine for long periods of time.  Empty your bladder before and after sexual intercourse.  After a bowel movement, women should cleanse from front to back. Use each tissue only once. SEEK MEDICAL CARE IF:   You have back pain.  You develop a fever.  Your symptoms do not begin to resolve within 3 days. SEEK IMMEDIATE MEDICAL CARE IF:   You have severe back pain or lower abdominal pain.  You develop chills.  You have nausea or vomiting.  You have continued burning or discomfort with urination. MAKE SURE YOU:   Understand these instructions.  Will watch your condition.  Will get help right away if you are not doing well or get worse.   This information is not intended to replace advice given to you by your health care provider. Make sure you discuss any questions you have with your health care provider.   Document Released: 02/15/2005 Document Revised: 01/27/2015 Document Reviewed: 06/16/2011 Elsevier Interactive Patient Education [employer]2016 Elsevier Inc.

## 2015-11-04 NOTE — ED Provider Notes (Signed)
CSN: [id number]     Arrival date & time 11/04/15  1157 History  By signing my name below, I, Marisa Kirby, attest that this documentation has been prepared under the direction and in the presence of Marisa Octave, MD.   Electronically Signed: Iona Kirby, ED Scribe. 11/04/2015. 12:04 PM   Chief Complaint  Patient presents with  . Flank Pain    The history is provided by the patient. No language interpreter was used.   HPI Comments: Marisa MUCHA is a 35 y.o. female with PMHx of substance abuse and kidney stones who presents to the Emergency Department complaining of gradual onset, stabbing, constant, left-sided abdominal pain, beginning this morning. Pt reports associated white vaginal discharge, decreased urination, and constipation. Pt had a stent placed approximately one month ago after lithotripsy by Dr. Derryl Harbor. Her stent was removed approximately 3 weeks ago. No other associated symptoms noted. Pt's pain is worse with eating. Pt has not taken any medications PTA. Nothing makes her symptoms better. Pt denies fever, chills, emesis, dysuria, hematuria, vaginal bleeding, IV drugs, or any other pertinent symptoms. LNMP was one month ago. Pt's last sexual activity was one week ago.  Past Medical History  Diagnosis Date  . Substance abuse   . Kidney stones    Past Surgical History  Procedure Laterality Date  . Tonsillectomy    . Stent place left ureter ([facility] hx)    . Lithotripsy    . Stent removal     No family history on file. Social History  Substance Use Topics  . Smoking status: Former Smoker -- 1.00 packs/day    Types: Cigarettes  . Smokeless tobacco: None  . Alcohol Use: Yes     Comment: occasionally   OB History    Gravida Para Term Preterm AB TAB SAB Ectopic Multiple Living   Review of Systems A complete 10 system review of systems was obtained and all systems are negative except as noted in the HPI and PMH.    Allergies   Tramadol  Home Medications   Prior to Admission medications   Medication Sig Start Date End Date Taking? Authorizing Provider  dexamethasone (DECADRON) 4 MG tablet 2 tablets day one. Then 1 tablet daily with food. 11/20/11   Ivery Quale, PA-C   BP 112/76 mmHg  Pulse 76  Temp(Src) 98.6 F (37 C) (Oral)  Resp 19  Ht  (1.6 m)  Wt 125 lb (56.7 kg)  BMI 22.15 kg/m2  SpO2 99%  LMP 10/04/2015 Physical Exam  Constitutional: She is oriented to person, place, and time. She appears well-developed and well-nourished. No distress.  Pt appears uncomfortable.  HENT:  Head: Normocephalic and atraumatic.  Mouth/Throat: Oropharynx is clear and moist. No oropharyngeal exudate.  Eyes: Conjunctivae and EOM are normal. Pupils are equal, round, and reactive to light.  Neck: Normal range of motion. Neck supple.  No meningismus.  Cardiovascular: Normal rate, regular rhythm, normal heart sounds and intact distal pulses.   No murmur heard. Pulmonary/Chest: Effort normal and breath sounds normal. No respiratory distress. She exhibits no tenderness.  Abdominal: Soft. There is tenderness. There is no rebound, no guarding and no CVA tenderness.  LUQ TTP with guarding. No CVA tenderness.  Genitourinary: Vaginal discharge found.  Chaperon present. White discharge in vaginal vault. No CMT or lateralizing adnexal pain.  Musculoskeletal: Normal range of motion. She exhibits no edema or tenderness.  No tenderness along  lateral ribs.  Neurological: She is alert and oriented to person, place, and time. No cranial nerve deficit. She exhibits normal muscle tone. Coordination normal.  No ataxia on finger to nose bilaterally. No pronator drift. 5/5 strength throughout. CN 2-12 intact.Equal grip strength. Sensation intact.   Skin: Skin is warm.  Psychiatric: She has a normal mood and affect. Her behavior is normal.  Nursing note and vitals reviewed.   ED Course  Procedures (including critical care  time) DIAGNOSTIC STUDIES: Oxygen Saturation is 99% on RA, normal by my interpretation.    COORDINATION OF CARE: 12:11 PM Discussed treatment plan with pt at bedside and pt agreed to plan.   Labs Review Labs Reviewed  WET PREP, GENITAL - Abnormal; Notable for the following:    Clue Cells Wet Prep HPF POC PRESENT (*)    WBC, Wet Prep HPF POC TOO NUMEROUS TO COUNT (*)    All other components within normal limits  URINALYSIS, ROUTINE W REFLEX MICROSCOPIC (NOT AT [facility]) - Abnormal; Notable for the following:    APPearance CLOUDY (*)    Leukocytes, UA MODERATE (*)    All other components within normal limits  PREGNANCY, URINE - Abnormal; Notable for the following:    Preg Test, Ur POSITIVE (*)    All other components within normal limits  CBC WITH DIFFERENTIAL/PLATELET - Abnormal; Notable for the following:    WBC 11.5 (*)    All other components within normal limits  BASIC METABOLIC PANEL - Abnormal; Notable for the following:    Glucose, Bld 101 (*)    Anion gap 4 (*)    All other components within normal limits  HCG, QUANTITATIVE, PREGNANCY - Abnormal; Notable for the following:    hCG, Beta Chain, Quant, S 63 (*)    All other components within normal limits  URINE MICROSCOPIC-ADD ON - Abnormal; Notable for the following:    Squamous Epithelial / LPF 0-5 (*)    Bacteria, UA MANY (*)    All other components within normal limits  URINE CULTURE  GC/CHLAMYDIA PROBE AMP ([place]) NOT AT [facility]    Imaging Review US Ob Comp Less 14 Wks  11/04/2015  CLINICAL DATA:  Pregnant patient with left flank pain. Lower abdominal pain and left flank pain since this morning. Positive urine pregnancy test. EXAM: OBSTETRIC <14 WK [place] AND TRANSVAGINAL OB US TECHNIQUE: Both transabdominal and transvaginal ultrasound examinations were performed for complete evaluation of the gestation as well as the maternal uterus, adnexal regions, and pelvic cul-de-sac. Transvaginal technique was performed to assess  early pregnancy. COMPARISON:  None. FINDINGS: Intrauterine gestational sac: Not seen Yolk sac:  Not seen Embryo:  Not seen Cardiac Activity: Not seen MSD:   mm    w     d CRL:    mm    w    d                  [place] EDC: Subchorionic hemorrhage:  None visualized. Maternal uterus/adnexae: Uterus appears normal. Endometrium is homogeneously thin with a normal demonstrated measurement of 3 mm. No mass or fluid within the endometrial canal. No intrauterine pregnancy seen. Both ovaries appear normal containing small follicles. Blood flow is shown within the parenchyma of the left ovary. Blood flow is not convincingly demonstrated within the parenchyma of the right ovary, however, full characterization with Doppler waveforms was not performed. No mass or free fluid identified within either adnexal region. IMPRESSION: 1. No intrauterine pregnancy identified. There is  no mass or free fluid seen within either adnexal region, however, ectopic pregnancy cannot be confidently excluded as yet given the positive pregnancy test and the lack of identifiable intrauterine pregnancy. Findings may indicate a very early pregnancy. Recommend correlation with beta HCG levels and follow-up pelvic ultrasound as needed. 2. Both ovaries appear normal and there is no mass or free fluid seen within either adnexal region. 3. Blood flow was not convincingly demonstrated within the parenchyma of the right ovary, however, full characterization with Doppler waveforms was not performed. No free fluid or other secondary signs of ovarian torsion are demonstrated, however, if patient has any right lower pelvic pain, would consider repeat exam with Doppler blood flow evaluation. Electronically Signed   By: Bary RichardStan  Maynard M.D.   On: 11/04/2015 14:52   [place] Transvaginal Non-ob  11/04/2015  CLINICAL DATA:  Pregnant patient with left flank pain. Lower abdominal pain and left flank pain since this morning. Positive urine pregnancy test. EXAM: OBSTETRIC <14 WK US  AND TRANSVAGINAL OB US TECHNIQUE: Both transabdominal and transvaginal ultrasound examinations were performed for complete evaluation of the gestation as well as the maternal uterus, adnexal regions, and pelvic cul-de-sac. Transvaginal technique was performed to assess early pregnancy. COMPARISON:  None. FINDINGS: Intrauterine gestational sac: Not seen Yolk sac:  Not seen Embryo:  Not seen Cardiac Activity: Not seen MSD:   mm    w     d CRL:    mm    w    d                  US EDC: Subchorionic hemorrhage:  None visualized. Maternal uterus/adnexae: Uterus appears normal. Endometrium is homogeneously thin with a normal demonstrated measurement of 3 mm. No mass or fluid within the endometrial canal. No intrauterine pregnancy seen. Both ovaries appear normal containing small follicles. Blood flow is shown within the parenchyma of the left ovary. Blood flow is not convincingly demonstrated within the parenchyma of the right ovary, however, full characterization with Doppler waveforms was not performed. No mass or free fluid identified within either adnexal region. IMPRESSION: 1. No intrauterine pregnancy identified. There is no mass or free fluid seen within either adnexal region, however, ectopic pregnancy cannot be confidently excluded as yet given the positive pregnancy test and the lack of identifiable intrauterine pregnancy. Findings may indicate a very early pregnancy. Recommend correlation with beta HCG levels and follow-up pelvic ultrasound as needed. 2. Both ovaries appear normal and there is no mass or free fluid seen within either adnexal region. 3. Blood flow was not convincingly demonstrated within the parenchyma of the right ovary, however, full characterization with Doppler waveforms was not performed. No free fluid or other secondary signs of ovarian torsion are demonstrated, however, if patient has any right lower pelvic pain, would consider repeat exam with Doppler blood flow evaluation. Electronically  Signed   By: Bary RichardStan  Maynard M.D.   On: 11/04/2015 14:52   [place] Renal  11/04/2015  CLINICAL DATA:  Pregnant patient with left flank pain. EXAM: RENAL / URINARY TRACT ULTRASOUND COMPLETE COMPARISON:  None. FINDINGS: Right Kidney: Length: 12.1 cm. Echogenicity within normal limits. No mass or hydronephrosis visualized. Left Kidney: Length: 12.2 cm. Mild renal collecting system dilation. Normal parenchymal echogenicity. No mass or stone. The Bladder: Moderately distended. Bilateral ureteral jets are noted. There is dependent debris in the bladder. No masses. IMPRESSION: 1. Mild left renal collecting system dilation. Given the patient's symptoms, consider a left ureteral stone which would  be best evaluated with unenhanced CT. 2. Some dependent debris in the bladder, which is nonspecific. Consider bladder infection. 3. No other abnormalities.  No intrarenal stones. Electronically Signed   By: Amie Portland M.D.   On: 11/04/2015 14:42   [place] Art/ven Flow Abd Pelv Doppler  11/04/2015  EXAM: DOPPLER ULTRASOUND OF OVARIES TECHNIQUE: Limited transabdominal ultrasound examination of the pelvis was performed to evaluate the ovaries and adnexa regions only. Color and duplex Doppler ultrasound was utilized to evaluate blood flow to the ovaries. COMPARISON:  Same date. FINDINGS: Normal grayscale appearance of the ovaries, previously documented. Pulsed Doppler evaluation demonstrates normal low-resistance arterial and venous waveforms in both ovaries. IMPRESSION: Normal bilateral adnexal blood flow. Electronically Signed   By: Ted Mcalpine M.D.   On: 11/04/2015 15:52   I have personally reviewed and evaluated these images and lab results as part of my medical decision-making.   EKG Interpretation None      MDM   Final diagnoses:  Flank pain  Pregnancy  Urinary tract infection without hematuria, site unspecified  Renal colic  Bacterial vaginosis   Patient from prison with left-sided flank pain onset today.  Feels like previous ureteral stent that she had placed 2 months ago. Endorses dysuria. No nausea or vomiting or fever.  Patient found to be pregnant. Reports last menstrual cycle one month ago. CT scan canceled.  UA positive for infection, culture sent.  Rocephin given.  D/w dr Mena Goes.  He reviewed images. He feels this may be passage of a previous kidney stone that she had shock wave on last month.  Agrees with IV rocephin, culture and outpatient followup with her urologist. Patient is afebrile and well appearing.  Pregnancy of undetermined location d/w Dr. Despina Hidden. HCG 63, likely too early to bee seen. He recommends repeat ultrasound in 2 weeks. Does not recommend follow-up hCG Quant at this point as this is an incidental finding and not contributing to her presentation today.  Will treat urinary tract infection, bacterial vaginosis, treat pain for potential kidney stone that is likely passage of her previous stone piece  She'll need follow-up with both gynecology and urology. She is going back to jail but hopes to be bonded out tomorrow. Return precautions discussed.  BP 106/70 mmHg  Pulse 62  Temp(Src) 98.6 F (37 C) (Oral)  Resp 19  Ht 5\' 3"  (1.6 m)  Wt 125 lb (56.7 kg)  BMI 22.15 kg/m2  SpO2 100%  LMP 10/04/2015   Marisa Octave, MD 11/04/15 2306

## 2015-11-05 LAB — GC/CHLAMYDIA PROBE AMP (~~LOC~~) NOT AT ARMC
Chlamydia: NEGATIVE
Neisseria Gonorrhea: NEGATIVE

## 2015-11-06 LAB — URINE CULTURE

## 2016-01-25 ENCOUNTER — Emergency Department (HOSPITAL_COMMUNITY)
Admission: EM | Admit: 2016-01-25 | Discharge: 2016-01-25 | Disposition: A | Discharge status: Home / Self Care | Payer: Medicaid Other | Attending: Emergency Medicine | Admitting: Emergency Medicine

## 2016-01-25 ENCOUNTER — Encounter (HOSPITAL_COMMUNITY): Payer: Self-pay | Admitting: Emergency Medicine

## 2016-01-25 DIAGNOSIS — Z87891 Personal history of nicotine dependence: Secondary | ICD-10-CM | POA: Insufficient documentation

## 2016-01-25 DIAGNOSIS — R110 Nausea: Secondary | ICD-10-CM | POA: Insufficient documentation

## 2016-01-25 DIAGNOSIS — R531 Weakness: Secondary | ICD-10-CM | POA: Insufficient documentation

## 2016-01-25 LAB — URINALYSIS, ROUTINE W REFLEX MICROSCOPIC
Bilirubin Urine: NEGATIVE
GLUCOSE, UA: NEGATIVE mg/dL
HGB URINE DIPSTICK: NEGATIVE
Ketones, ur: NEGATIVE mg/dL
Leukocytes, UA: NEGATIVE
Nitrite: NEGATIVE
PH: 6.5 (ref 5.0–8.0)
Protein, ur: NEGATIVE mg/dL
SPECIFIC GRAVITY, URINE: 1.015 (ref 1.005–1.030)

## 2016-01-25 LAB — BASIC METABOLIC PANEL
Anion gap: 1 — ABNORMAL LOW (ref 5–15)
BUN: 16 mg/dL (ref 6–20)
CALCIUM: 8.6 mg/dL — AB (ref 8.9–10.3)
CO2: 24 mmol/L (ref 22–32)
CREATININE: 0.6 mg/dL (ref 0.44–1.00)
Chloride: 111 mmol/L (ref 101–111)
GFR calc Af Amer: >60 mL/min (ref >60)
GFR calc non Af Amer: >60 mL/min (ref >60)
GLUCOSE: 88 mg/dL (ref 65–99)
Potassium: 3.8 mmol/L (ref 3.5–5.1)
Sodium: 136 mmol/L (ref 135–145)

## 2016-01-25 LAB — CBC WITH DIFFERENTIAL/PLATELET
Basophils Absolute: 0.1 10*3/uL (ref 0.0–0.1)
Basophils Relative: 1 %
EOS PCT: 1 %
Eosinophils Absolute: 0.1 10*3/uL (ref 0.0–0.7)
HEMATOCRIT: 43.1 % (ref 36.0–46.0)
Hemoglobin: 14.3 g/dL (ref 12.0–15.0)
LYMPHS PCT: 34 %
Lymphs Abs: 3.5 10*3/uL (ref 0.7–4.0)
MCH: 30.5 pg (ref 26.0–34.0)
MCHC: 33.2 g/dL (ref 30.0–36.0)
MCV: 91.9 fL (ref 78.0–100.0)
MONO ABS: 0.7 10*3/uL (ref 0.1–1.0)
MONOS PCT: 7 %
NEUTROS ABS: 5.8 10*3/uL (ref 1.7–7.7)
Neutrophils Relative %: 57 %
Platelets: 280 10*3/uL (ref 150–400)
RBC: 4.69 MIL/uL (ref 3.87–5.11)
RDW: 13.3 % (ref 11.5–15.5)
WBC: 10.2 10*3/uL (ref 4.0–10.5)

## 2016-01-25 LAB — PREGNANCY, URINE: Preg Test, Ur: NEGATIVE

## 2016-01-25 MED ORDER — SODIUM CHLORIDE 0.9 % IV BOLUS (SEPSIS)
1000.0000 mL | Freq: Once | INTRAVENOUS | Status: AC
  Administered 2016-01-25: 1000 mL via INTRAVENOUS

## 2016-01-25 MED ORDER — SODIUM CHLORIDE 0.9 % IV BOLUS (SEPSIS): 1000.0000 mL | Freq: Once | INTRAVENOUS | Status: DC

## 2016-01-25 NOTE — ED Notes (Signed)
Patient verbalizes understanding of discharge instructions, home care and follow up care if needed. Patient out of department at this time. 

## 2016-01-25 NOTE — ED Notes (Signed)
MD at bedside. 

## 2016-01-25 NOTE — ED Triage Notes (Signed)
Pt c/o generalized weakness, nausea, and cloudy urine x 2-3 days.

## 2016-01-25 NOTE — Discharge Instructions (Signed)

## 2016-01-25 NOTE — ED Provider Notes (Signed)
[facility] DEPT Provider Note   CSN: [id number] Arrival date & time: 01/25/16  1520     History   Chief Complaint Chief Complaint  Patient presents with  . Weakness    HPI Marisa Kirby is a 35 y.o. female.  The history is provided by the patient.  Weakness  This is a new problem. The current episode started more than 2 days ago. The problem has been gradually worsening. There was no focality noted. There has been no fever. Pertinent negatives include no shortness of breath, no chest pain, no vomiting and no headaches.   Patient reports fatigue, nausea for past 3 days No fever/HA No CP/SOB No focal weakness No syncope She does not take any meds  Past Medical History:  Diagnosis Date  . Kidney stones   . Substance abuse     There are no active problems to display for this patient.   Past Surgical History:  Procedure Laterality Date  . LITHOTRIPSY    . STENT PLACE LEFT URETER ([facility] HX)    . STENT REMOVAL    . TONSILLECTOMY      OB History    Gravida Para Term Preterm AB Living   5 2     1      SAB TAB Ectopic Multiple Live Births                   Home Medications    Prior to Admission medications   Medication Sig Start Date End Date Taking? Authorizing Provider  cephALEXin (KEFLEX) 500 MG capsule Take 1 capsule (500 mg total) by mouth 4 (four) times daily. 11/04/15   Glynn OctaveStephen Rancour, MD  dexamethasone (DECADRON) 4 MG tablet 2 tablets day one. Then 1 tablet daily with food. Patient not taking: Reported on 11/04/2015 11/20/11   Ivery QualeHobson Bryant, PA-C  HYDROcodone-acetaminophen (NORCO/VICODIN) 5-325 MG tablet Take 1 tablet by mouth every 4 (four) hours as needed. 11/04/15   Glynn OctaveStephen Rancour, MD  metroNIDAZOLE (FLAGYL) 500 MG tablet Take 1 tablet (500 mg total) by mouth 2 (two) times daily. 11/04/15   Glynn OctaveStephen Rancour, MD  promethazine (PHENERGAN) 25 MG tablet Take 1 tablet (25 mg total) by mouth every 6 (six) hours as needed for nausea or vomiting. 11/04/15    Glynn OctaveStephen Rancour, MD    Family History No family history on file.  Social History Social History  Substance Use Topics  . Smoking status: Former Smoker    Packs/day: 1.00    Types: Cigarettes  . Smokeless tobacco: Never Used  . Alcohol use No     Comment: quit drinking in 2016     Allergies   Tramadol   Review of Systems Review of Systems  Constitutional: Positive for fatigue. Negative for fever.  Respiratory: Negative for shortness of breath.   Cardiovascular: Negative for chest pain.  Gastrointestinal: Positive for nausea. Negative for blood in stool, diarrhea and vomiting.  Genitourinary: Negative for vaginal bleeding.  Neurological: Positive for weakness. Negative for syncope and headaches.  All other systems reviewed and are negative.    Physical Exam Updated Vital Signs BP 100/58 (BP Location: Left Arm)   Pulse 88   Temp 98.3 F (36.8 C) (Oral)   Resp 16   Ht 5\' 3"  (1.6 m)   Wt 54.4 kg   LMP 12/21/2015   SpO2 100%   BMI 21.26 kg/m   Physical Exam CONSTITUTIONAL: Well developed/well nourished HEAD: Normocephalic/atraumatic EYES: EOMI/PERRL, no nystagmus,no ptosis ENMT: Mucous membranes moist NECK: supple no meningeal  signs CV: S1/S2 noted, no murmurs/rubs/gallops noted LUNGS: Lungs are clear to auscultation bilaterally, no apparent distress ABDOMEN: soft, nontender NEURO:Awake/alert, face symmetric, no arm or leg drift is noted Cranial nerves 3/4/5/6/11/27/08/11/12 tested and intact Gait normal without ataxia No past pointing Sensation to light touch intact in all extremities EXTREMITIES: pulses normal, full ROM SKIN: warm, color normal PSYCH: no abnormalities of mood noted   ED Treatments / Results  Labs (all labs ordered are listed, but only abnormal results are displayed) Labs Reviewed  BASIC METABOLIC PANEL - Abnormal; Notable for the following:       Result Value   Calcium 8.6 (*)    Anion gap 1 (*)    All other components within  normal limits  URINALYSIS, ROUTINE W REFLEX MICROSCOPIC (NOT AT [facility])  PREGNANCY, URINE  CBC WITH DIFFERENTIAL/PLATELET    EKG  EKG Interpretation  Date/Time:  Tuesday January 25 2016 16:27:59 EDT Ventricular Rate:  63 PR Interval:    QRS Duration: 97 QT Interval:  389 QTC Calculation: 399 R Axis:   82 Text Interpretation:  Sinus rhythm Baseline wander in lead(s) V4 ECG OTHERWISE WITHIN NORMAL LIMITS No previous ECGs available Confirmed by Bebe Shaggy  MD, Annsley Akkerman ([id number]) on 01/25/2016 4:42:13 PM       Radiology No results found.  Procedures Procedures (including critical care time)  Medications Ordered in ED Medications  sodium chloride 0.9 % bolus 1,000 mL (0 mLs Intravenous Stopped 01/25/16 1741)  sodium chloride 0.9 % bolus 1,000 mL (0 mLs Intravenous Stopped 01/25/16 1846)     Initial Impression / Assessment and Plan / ED Course  I have reviewed the triage vital signs and the nursing notes.  Pertinent labs results that were available during my care of the patient were reviewed by me and considered in my medical decision making (see chart for details).  Clinical Course    After IV fluids, pt improved She ambulated SBP >100 She is awake/alert No other new complaints, reports just feeling "tired" Will d/c home Advised to see PCP in next 1-2 weeks for recheck as she may need more extensive testing We discussed strict return precautions BP 103/57   Pulse 76   Temp 98.3 F (36.8 C) (Oral)   Resp 19   Ht 5\' 3"  (1.6 m)   Wt 54.4 kg   LMP 12/21/2015   SpO2 99%   BMI 21.26 kg/m   Final Clinical Impressions(s) / ED Diagnoses   Final diagnoses:  Weakness    New Prescriptions New Prescriptions   No medications on file     Marisa Rhine, MD 01/25/16 [phone]

## 2016-03-18 ENCOUNTER — Encounter (HOSPITAL_COMMUNITY): Payer: Self-pay | Admitting: Emergency Medicine

## 2016-03-18 ENCOUNTER — Emergency Department (HOSPITAL_COMMUNITY)
Admission: EM | Admit: 2016-03-18 | Discharge: 2016-03-18 | Disposition: A | Discharge status: Home / Self Care | Payer: Self-pay | Attending: Emergency Medicine | Admitting: Emergency Medicine

## 2016-03-18 ENCOUNTER — Emergency Department (HOSPITAL_COMMUNITY): Payer: Self-pay

## 2016-03-18 DIAGNOSIS — Z79899 Other long term (current) drug therapy: Secondary | ICD-10-CM | POA: Insufficient documentation

## 2016-03-18 DIAGNOSIS — M79641 Pain in right hand: Secondary | ICD-10-CM | POA: Insufficient documentation

## 2016-03-18 DIAGNOSIS — F17210 Nicotine dependence, cigarettes, uncomplicated: Secondary | ICD-10-CM | POA: Insufficient documentation

## 2016-03-18 MED ORDER — ACETAMINOPHEN 500 MG PO TABS
1000.0000 mg | ORAL_TABLET | Freq: Once | ORAL | Status: AC
  Administered 2016-03-18: 1000 mg via ORAL
  Filled 2016-03-18: qty 2

## 2016-03-18 MED ORDER — NAPROXEN 250 MG PO TABS: 250.0000 mg | ORAL_TABLET | Freq: Two times a day (BID) | ORAL | 0 refills | Status: DC | PRN

## 2016-03-18 MED ORDER — IBUPROFEN 400 MG PO TABS
400.0000 mg | ORAL_TABLET | Freq: Once | ORAL | Status: AC
  Administered 2016-03-18: 400 mg via ORAL
  Filled 2016-03-18: qty 1

## 2016-03-18 NOTE — ED Provider Notes (Signed)
[facility]-EMERGENCY DEPT Provider Note   CSN: [id number] Arrival date & time: 03/18/16  1349     History   Chief Complaint Chief Complaint  Patient presents with  . Hand Pain    HPI [name] is a 35 y.o. female.  HPI  Pt was seen at 1355. Per pt, c/o gradual onset and persistence of waxing and waning right lateral hand "pain" for the past 1 month, worse over the past 1 week. Pt states her dorsal right hand "hurts over the bone" (5th metacarpal). Pain worsens with palpation of the area and movement of her hand. Pt is right handed. Denies injury, no fevers, no rash, no focal motor weakness, no tingling/numbness in extremities.    Past Medical History:  Diagnosis Date  . Kidney stones   . Substance abuse     There are no active problems to display for this patient.   Past Surgical History:  Procedure Laterality Date  . LITHOTRIPSY    . STENT PLACE LEFT URETER ([facility] HX)    . STENT REMOVAL    . TONSILLECTOMY      OB History    Gravida Para Term Preterm AB Living   5 2     1 2    SAB TAB Ectopic Multiple Live Births                   Home Medications    Prior to Admission medications   Medication Sig Start Date End Date Taking? Authorizing Provider  cephALEXin (KEFLEX) 500 MG capsule Take 1 capsule (500 mg total) by mouth 4 (four) times daily. Patient not taking: Reported on 01/25/2016 11/04/15   Glynn OctaveStephen Rancour, MD  dexamethasone (DECADRON) 4 MG tablet 2 tablets day one. Then 1 tablet daily with food. Patient not taking: Reported on 11/04/2015 11/20/11   Ivery QualeHobson Bryant, PA-C  HYDROcodone-acetaminophen (NORCO/VICODIN) 5-325 MG tablet Take 1 tablet by mouth every 4 (four) hours as needed. Patient not taking: Reported on 01/25/2016 11/04/15   Glynn OctaveStephen Rancour, MD  metroNIDAZOLE (FLAGYL) 500 MG tablet Take 1 tablet (500 mg total) by mouth 2 (two) times daily. Patient not taking: Reported on 01/25/2016 11/04/15   Glynn OctaveStephen Rancour, MD  promethazine (PHENERGAN) 25 MG tablet Take 1  tablet (25 mg total) by mouth every 6 (six) hours as needed for nausea or vomiting. Patient not taking: Reported on 01/25/2016 11/04/15   Glynn OctaveStephen Rancour, MD    Family History History reviewed. No pertinent family history.  Social History Social History  Substance Use Topics  . Smoking status: Current Every Day Smoker    Packs/day: 0.50    Types: Cigarettes  . Smokeless tobacco: Never Used  . Alcohol use No     Comment: quit drinking in 2016     Allergies   Tramadol   Review of Systems Review of Systems ROS: Statement: All systems negative except as marked or noted in the HPI; Constitutional: Negative for fever and chills. ; ; Eyes: Negative for eye pain, redness and discharge. ; ; ENMT: Negative for ear pain, hoarseness, nasal congestion, sinus pressure and sore throat. ; ; Cardiovascular: Negative for chest pain, palpitations, diaphoresis, dyspnea and peripheral edema. ; ; Respiratory: Negative for cough, wheezing and stridor. ; ; Gastrointestinal: Negative for nausea, vomiting, diarrhea, abdominal pain, blood in stool, hematemesis, jaundice and rectal bleeding. . ; ; Genitourinary: Negative for dysuria, flank pain and hematuria. ; ; Musculoskeletal: +right hand pain. Negative for back pain and neck pain. Negative for swelling and trauma.; ;  Skin: Negative for pruritus, rash, abrasions, blisters, bruising and skin lesion.; ; Neuro: Negative for headache, lightheadedness and neck stiffness. Negative for weakness, altered level of consciousness, altered mental status, extremity weakness, paresthesias, involuntary movement, seizure and syncope.      Physical Exam Updated Vital Signs BP 106/70 (BP Location: Left Arm)   Pulse 96   Temp 98 F (36.7 C) (Oral)   Resp 18   Ht 5' 3.5" (1.613 m)   Wt 140 lb (63.5 kg)   LMP 03/04/2016   SpO2 99%   BMI 24.41 kg/m   Physical Exam 1400: Physical examination:  Nursing notes reviewed; Vital signs and O2 SAT reviewed;  Constitutional: Well  developed, Well nourished, Well hydrated, In no acute distress; Head:  Normocephalic, atraumatic; Eyes: EOMI, PERRL, No scleral icterus; ENMT: Mouth and pharynx normal, Mucous membranes moist; Neck: Supple, Full range of motion, No lymphadenopathy; Cardiovascular: Regular rate and rhythm, No gallop; Respiratory: Breath sounds clear & equal bilaterally, No wheezes.  Speaking full sentences with ease, Normal respiratory effort/excursion; Chest: Nontender, Movement normal; Abdomen: Soft, Nontender, Nondistended, Normal bowel sounds; Genitourinary: No CVA tenderness; Extremities: Pulses normal, NT right fingers/wrist/elbow/shoulder. Strong radial pulse, brisk cap refill in fingertips, muscles compartments soft.  +right dorsal 5th metacarpal tenderness to palp, no deformity, no rash, no edema. No palmar hand tenderness..; Neuro: AA&Ox3, Major CN grossly intact.  Speech clear. No gross focal motor or sensory deficits in extremities.; Skin: Color normal, Warm, Dry.   ED Treatments / Results  Labs (all labs ordered are listed, but only abnormal results are displayed)   EKG  EKG Interpretation None       Radiology   Procedures Procedures (including critical care time)  Medications Ordered in ED Medications  ibuprofen (ADVIL,MOTRIN) tablet 400 mg (400 mg Oral Given 03/18/16 1418)  acetaminophen (TYLENOL) tablet 1,000 mg (1,000 mg Oral Given 03/18/16 1418)     Initial Impression / Assessment and Plan / ED Course  I have reviewed the triage vital signs and the nursing notes.  Pertinent labs & imaging results that were available during my care of the patient were reviewed by me and considered in my medical decision making (see chart for details).  MDM Reviewed: previous chart, nursing note and vitals Interpretation: x-ray    Dg Hand Complete Right Result Date: 03/18/2016 CLINICAL DATA:  Right hand pain for the past week.  No known injury. EXAM: RIGHT HAND - COMPLETE 3+ VIEW COMPARISON:   None. FINDINGS: There is no evidence of fracture or dislocation. There is no evidence of arthropathy or other focal bone abnormality. Soft tissues are unremarkable. IMPRESSION: Normal examination. Electronically Signed   By: Beckie SaltsSteven  Reid M.D.   On: 03/18/2016 14:29    1430:  XR reassuring. Tx symptomatically. Dx and testing d/w pt and family.  Questions answered.  Verb understanding, agreeable to d/c home with outpt f/u.    Final Clinical Impressions(s) / ED Diagnoses   Final diagnoses:  None    New Prescriptions New Prescriptions   No medications on file     Samuel JesterKathleen Gaylon Bentz, DO 03/20/16 2054

## 2016-03-18 NOTE — ED Triage Notes (Signed)
PT c/o right hand pain to lateral hand and pain with 5th digit ROM for over a week and denies any injury.

## 2016-03-18 NOTE — Discharge Instructions (Signed)
Take the prescription as directed.  Apply moist heat or ice to the area(s) of discomfort, for 15 minutes at a time, several times per day for the next few days.  Do not fall asleep on a heating or ice pack.  Wear the splint for comfort until you are seen in follow up. Call the Orthopedic doctor on Monday to schedule a follow up appointment this week.  Return to the Emergency Department immediately if worsening.

## 2016-06-02 ENCOUNTER — Other Ambulatory Visit: Payer: Self-pay | Admitting: Obstetrics and Gynecology

## 2016-06-02 DIAGNOSIS — O3680X0 Pregnancy with inconclusive fetal viability, not applicable or unspecified: Secondary | ICD-10-CM

## 2016-06-05 ENCOUNTER — Other Ambulatory Visit: Payer: Self-pay

## 2016-06-08 ENCOUNTER — Other Ambulatory Visit: Payer: Self-pay

## 2016-06-14 ENCOUNTER — Ambulatory Visit (INDEPENDENT_AMBULATORY_CARE_PROVIDER_SITE_OTHER): Payer: PRIVATE HEALTH INSURANCE

## 2016-06-14 DIAGNOSIS — Z3A11 11 weeks gestation of pregnancy: Secondary | ICD-10-CM

## 2016-06-14 DIAGNOSIS — O3680X0 Pregnancy with inconclusive fetal viability, not applicable or unspecified: Secondary | ICD-10-CM | POA: Diagnosis not present

## 2016-06-14 NOTE — Progress Notes (Addendum)
US 11 wks,single IUP w/ys, pos fht 167 bpm,crl 4.2 mm,normal ov's bilat,EDD 01/03/2017 by ultrasound

## 2017-02-05 ENCOUNTER — Emergency Department (HOSPITAL_COMMUNITY): Payer: Medicaid Other

## 2017-02-05 ENCOUNTER — Encounter (HOSPITAL_COMMUNITY): Payer: Self-pay | Admitting: *Deleted

## 2017-02-05 ENCOUNTER — Emergency Department (HOSPITAL_COMMUNITY)
Admission: EM | Admit: 2017-02-05 | Discharge: 2017-02-05 | Disposition: A | Discharge status: Home / Self Care | Payer: Medicaid Other | Attending: Emergency Medicine | Admitting: Emergency Medicine

## 2017-02-05 DIAGNOSIS — F17210 Nicotine dependence, cigarettes, uncomplicated: Secondary | ICD-10-CM | POA: Diagnosis not present

## 2017-02-05 DIAGNOSIS — Z3A18 18 weeks gestation of pregnancy: Secondary | ICD-10-CM | POA: Diagnosis not present

## 2017-02-05 DIAGNOSIS — O209 Hemorrhage in early pregnancy, unspecified: Secondary | ICD-10-CM | POA: Diagnosis present

## 2017-02-05 DIAGNOSIS — R102 Pelvic and perineal pain: Secondary | ICD-10-CM | POA: Diagnosis not present

## 2017-02-05 DIAGNOSIS — O4690 Antepartum hemorrhage, unspecified, unspecified trimester: Secondary | ICD-10-CM

## 2017-02-05 LAB — TYPE AND SCREEN
ABO/RH(D): A POS
Antibody Screen: NEGATIVE

## 2017-02-05 LAB — ABO/RH: ABO/RH(D): A POS

## 2017-02-05 LAB — URINALYSIS, ROUTINE W REFLEX MICROSCOPIC
Bilirubin Urine: NEGATIVE
Glucose, UA: NEGATIVE mg/dL
Hgb urine dipstick: NEGATIVE
Ketones, ur: 20 mg/dL — AB
Leukocytes, UA: NEGATIVE
Nitrite: NEGATIVE
Protein, ur: NEGATIVE mg/dL
Specific Gravity, Urine: 1.014 (ref 1.005–1.030)
pH: 8 (ref 5.0–8.0)

## 2017-02-05 LAB — WET PREP, GENITAL
Clue Cells Wet Prep HPF POC: NONE SEEN
Sperm: NONE SEEN
Trich, Wet Prep: NONE SEEN
Yeast Wet Prep HPF POC: NONE SEEN

## 2017-02-05 LAB — CBC
HEMATOCRIT: 36 % (ref 36.0–46.0)
HEMOGLOBIN: 12 g/dL (ref 12.0–15.0)
MCH: 29.7 pg (ref 26.0–34.0)
MCHC: 33.3 g/dL (ref 30.0–36.0)
MCV: 89.1 fL (ref 78.0–100.0)
Platelets: 292 10*3/uL (ref 150–400)
RBC: 4.04 MIL/uL (ref 3.87–5.11)
RDW: 13.5 % (ref 11.5–15.5)
WBC: 10.1 10*3/uL (ref 4.0–10.5)

## 2017-02-05 LAB — HCG, QUANTITATIVE, PREGNANCY: HCG, BETA CHAIN, QUANT, S: 25529 m[IU]/mL — AB (ref <5)

## 2017-02-05 MED ORDER — ACETAMINOPHEN 500 MG PO TABS: 1000.0000 mg | ORAL_TABLET | Freq: Once | ORAL | Status: DC

## 2017-02-05 MED ORDER — RHO D IMMUNE GLOBULIN 1500 UNIT/2ML IJ SOSY: 300.0000 ug | PREFILLED_SYRINGE | Freq: Once | INTRAMUSCULAR | Status: DC

## 2017-02-05 NOTE — Discharge Instructions (Signed)
Youmay take 500-1000mg  of tylenol every 6 hours as needed for pain. Do not exceed 4000 mg of Tylenol daily. Drink plenty of fluids and eat small meals frequently throughout the day to avoid nausea. Follow-up with an OB/GYN in the next 24-48 hours is extremely important. Return to the ED immediately if any concerning signs or symptoms develop.

## 2017-02-05 NOTE — ED Triage Notes (Signed)
To ED for eval of abd cramping and vaginal bleeding since this am. Pt states she was 17wks and 4 day pregnant on 8/30. Pt denies passing clots. States she was in prison in March and had miscarriage at 17wks - sent to hospital in [place] for [facility]. Released from prison around 4/24 and states pregnant within a week. Doesn't have OB currently but had ultrasound done at a wom[facility] clinic.

## 2017-02-05 NOTE — ED Notes (Signed)
Patient transported to Ultrasound 

## 2017-02-05 NOTE — ED Provider Notes (Signed)
[facility]-EMERGENC[facility] DEPT Provider Note   CSN: [id number] Arrival date & time: 02/05/17  0931     History   Chief Complaint Chief Complaint  Patient presents with  . Abdominal Pain  . Vaginal Bleeding    HPI Marisa Kirby is a 36 y.o. female. [name] female who is 20wk 1d pregnant with PMHx nephrolithiasis and substance abuse who presents today with chief complaint acute onset of vaginal bleeding and lower abdominal pain this morning. She states that when she awoke at around 7 AM and went to the bathroom, she noted bright red blood in the commode and on wiping. She endorses urinary frequency, but states this has been ongoing with the pregnancy. She notes decreased fetal movement yesterday and today and states that "he typically moves around like crazy". Also endorses mild lower abdominal pressure and mild cramping but statesthis is nothing like her typical menstrual cramps in severity. Pain worsens with sitting upright. She has some nausea but no vomiting. Endorses subjective fevers and chills. Denies chest pain, but will occasionally become short of breath when her pain intensifies. Denies constipation, diarrhea, melena, hematochezia, or other urinary symptoms. She states this feels somewhat similar to a UTI because "my kidneys hurt".Her last menstrual period was in March. She underwent a D&C after her miscarriage while in prison in April. She found out that she was 17 weeks and 4 days pregnant on August 30 during an ultrasound at a women's clinic. She does not have any OB follow-up, but was in the process of establishing Medicaid. She does endorse cigarette smoking as well as occasional marijuana use but denies any other drug use or alcohol use. Has not tried anything for her symptoms.  The history is provided by the patient.  Vaginal Bleeding  Primary symptoms include vaginal bleeding.  Primary symptoms include no dysuria. Associated symptoms include abdominal pain, nausea and frequency. Pertinent  negatives include no constipation, no diarrhea and no vomiting.    Past Medical History:  Diagnosis Date  . Kidney stones   . Substance abuse     There are no active problems to display for this patient.   Past Surgical History:  Procedure Laterality Date  . LITHOTRIPSY    . STENT PLACE LEFT URETER ([facility] HX)    . STENT REMOVAL    . TONSILLECTOMY      OB History    Gravida Para Term Preterm AB Living   SAB TAB Ectopic Multiple Live Births                   Home Medications    Prior to Admission medications   Medication Sig Start Date End Date Taking? Authorizing Provider  acetaminophen (TYLENOL) 500 MG tablet Take 1,000 mg by mouth every 6 (six) hours as needed for mild pain or headache.   Yes [provider]  Prenatal Vit-Fe Fumarate-FA (PRENATAL MULTIVITAMIN) TABS tablet Take 1 tablet by mouth daily at 12 noon.   Yes [provider]  cephALEXin (KEFLEX) 500 MG capsule Take 1 capsule (500 mg total) by mouth 4 (four) times daily. Patient not taking: Reported on 01/25/2016 11/04/15   Rancour, Jeannett Senior, MD  dexamethasone (DECADRON) 4 MG tablet 2 tablets day one. Then 1 tablet daily with food. Patient not taking: Reported on 11/04/2015 11/20/11   Ivery Quale, PA-C  HYDROcodone-acetaminophen (NORCO/VICODIN) 5-325 MG tablet Take 1 tablet by mouth every 4 (four) hours as needed. Patient not taking: Reported  on 01/25/2016 11/04/15   Glynn Octave, MD  metroNIDAZOLE (FLAGYL) 500 MG tablet Take 1 tablet (500 mg total) by mouth 2 (two) times daily. Patient not taking: Reported on 01/25/2016 11/04/15   Glynn Octave, MD  naproxen (NAPROSYN) 250 MG tablet Take 1 tablet (250 mg total) by mouth 2 (two) times daily as needed for mild pain or moderate pain (take with food). 03/18/16   Samuel Jester, DO  promethazine (PHENERGAN) 25 MG tablet Take 1 tablet (25 mg total) by mouth every 6 (six) hours as needed for nausea or vomiting. Patient not taking:  Reported on 01/25/2016 11/04/15   Glynn Octave, MD    Family History No family history on file.  Social History Social History  Substance Use Topics  . Smoking status: Current Every Day Smoker    Packs/day: 0.50    Types: Cigarettes  . Smokeless tobacco: Never Used  . Alcohol use No     Comment: quit drinking in 2016     Allergies   Tramadol   Review of Systems Review of Systems  Constitutional: Positive for chills and fever.  Respiratory: Negative for shortness of breath (when pain worsens).   Cardiovascular: Negative for chest pain.  Gastrointestinal: Positive for abdominal pain and nausea. Negative for constipation, diarrhea and vomiting.  Genitourinary: Positive for frequency and vaginal bleeding. Negative for dysuria, hematuria, vaginal discharge and vaginal pain.  Musculoskeletal: Positive for back pain.  All other systems reviewed and are negative.    Physical Exam Updated Vital Signs BP 123/80 (BP Location: Right Arm)   Pulse 88   Temp (!) 97.5 F (36.4 C) (Oral)   Resp 16   LMP 09/03/2016 Comment: pt had miscarriage in march with DNC  SpO2 100%   Physical Exam  Constitutional: She appears well-developed and well-nourished. No distress.  HENT:  Head: Normocephalic and atraumatic.  Eyes: Conjunctivae are normal. Right eye exhibits no discharge. Left eye exhibits no discharge.  Neck: No JVD present. No tracheal deviation present.  Cardiovascular: Normal rate, regular rhythm and normal heart sounds.   Pulmonary/Chest: Effort normal and breath sounds normal. No respiratory distress. She has no wheezes. She has no rales. She exhibits no tenderness.  Abdominal: Soft. She exhibits no distension. There is tenderness.  Abdomen is soft, hypoactive bowel sounds. Fundus is approximately just under the level of the umbilicus. Mild diffuse lower abdominal tenderness to palpation, Murphy sign absent, Rovsing's absent. No CVA tenderness. Fetal heart tones 161 in the LLQ    Genitourinary:  Genitourinary Comments: Examination performed in the presence of a chaperone. No lesions to the external genitalia. Cervical os is closed but there is blood in the vaginal vault. No masses or lesions to the vaginal wall.  Musculoskeletal: She exhibits no edema.  No midline spine TTP, no paraspinal muscle tenderness, no deformity, crepitus, or step-off noted   Neurological: She is alert.  Skin: No erythema.  Psychiatric: She has a normal mood and affect. Her behavior is normal.  Nursing note and vitals reviewed.    ED Treatments / Results  Labs (all labs ordered are listed, but only abnormal results are displayed) Labs Reviewed  WET PREP, GENITAL - Abnormal; Notable for the following:       Result Value   WBC, Wet Prep HPF POC MODERATE (*)    All other components within normal limits  HCG, QUANTITATIVE, PREGNANCY - Abnormal; Notable for the following:    hCG, Beta Chain, Quant, S 25,529 (*)    All other components  within normal limits  URINALYSIS, ROUTINE W REFLEX MICROSCOPIC - Abnormal; Notable for the following:    APPearance HAZY (*)    Ketones, ur 20 (*)    All other components within normal limits  URINE CULTURE  CBC  RPR  HIV ANTIBODY (ROUTINE TESTING)  TYPE AND SCREEN  ABO/RH  GC/CHLAMYDIA PROBE AMP ([facility]) NOT AT [facility]    EKG  EKG Interpretation None       Radiology US Ob Limited > 14 Wks  Result Date: 02/05/2017 CLINICAL DATA:  Acute onset vaginal bleeding this morning. EXAM: LIMITED OBSTETRIC ULTRASOUND FINDINGS: Number of Fetuses: 1 Heart Rate:  153 bpm Movement: Yes Presentation: Transverse Placental Location: Posterior Previa: No Amniotic Fluid (Subjective):  Within normal limits. BPD:  4.1cm 18w  2d MATERNAL FINDINGS: Cervix:  Appears closed. Uterus/Adnexae: Both ovaries are normal in appearance. No abnormality visualized. IMPRESSION: Single living IUP at approximately 18 weeks. No placental abruption or previa identified. This exam is  performed on an emergent basis and does not comprehensively evaluate fetal size, dating, or anatomy; follow-up complete OB US should be considered if further fetal assessment is warranted. Electronically Signed   By: Myles Rosenthal M.D.   On: 02/05/2017 12:57    Procedures Procedures (including critical care time)  Medications Ordered in ED Medications  acetaminophen (TYLENOL) tablet 1,000 mg (not administered)  rho (d) immune globulin (RHIG/RHOPHYLAC) injection 300 mcg (not administered)     Initial Impression / Assessment and Plan / ED Course  I have reviewed the triage vital signs and the nursing notes.  Pertinent labs & imaging results that were available during my care of the patient were reviewed by me and considered in my medical decision making (see chart for details).     [id number] female presents with vaginal bleeding and mild lower abdominal cramping. Afebrile, vital signs are stable. She has not had any prenatal care for this pregnancy. No leukocytosis, no electrolyte abnormalities, and she is not anemic. Fetal heart tones are present Ultrasound shows single living IUP at approximately 18 weeks with no placental abruption or previa identified. Doubt ovarian torsion or TOA. Beta-hCG is low  (4 weeks) in comparison to the age of the pregnancy. This coupled with vaginal bleeding suggests possible miscarriage/abortion. Doubt appendicitis, colitis, obstruction, perforation, or acute surgical intra-abdominal pathology. She is Rh+, rhogam not needed. On reevaluation, patient is resting comfortably and in no apparent distress. She is tolerating by mouth and ambulatory.stable for discharge home with OB follow-up in the next 24-48 hours. STI workup completed, she will be informed if any lab work is abnormal.Discussed indications for return to the ED. Patient and patient's significant other verbalized understanding of and agreement with plan and patient is stable for discharge home at this  time.  Final Clinical Impressions(s) / ED Diagnoses   Final diagnoses:  Vaginal bleeding in pregnancy    New Prescriptions New Prescriptions   No medications on file     Bennye Alm 02/05/17 1523    Linwood Dibbles, MD 02/09/17 [phone]

## 2017-02-05 NOTE — ED Notes (Signed)
Patient presents to ed c/o lower abd.pressure states she is about 20 weeks preg. G10- P2- A7. C/o vaginal bleeding onset last pm , states she hasn't felt any movement this am.

## 2017-02-05 NOTE — ED Notes (Signed)
Patient returned from Ultrasound. 

## 2017-02-05 NOTE — ED Notes (Signed)
Got patient undress patient is resting with family at bedside 

## 2017-02-06 LAB — URINE CULTURE: Culture: NO GROWTH

## 2017-02-06 LAB — GC/CHLAMYDIA PROBE AMP (~~LOC~~) NOT AT ARMC
Chlamydia: NEGATIVE
Neisseria Gonorrhea: NEGATIVE

## 2017-02-06 LAB — HIV ANTIBODY (ROUTINE TESTING W REFLEX): HIV Screen 4th Generation wRfx: NONREACTIVE

## 2017-02-06 LAB — RPR: RPR Ser Ql: NONREACTIVE

## 2017-03-20 ENCOUNTER — Other Ambulatory Visit: Payer: Self-pay | Admitting: Obstetrics & Gynecology

## 2017-03-20 DIAGNOSIS — Z363 Encounter for antenatal screening for malformations: Secondary | ICD-10-CM

## 2017-03-20 DIAGNOSIS — O0930 Supervision of pregnancy with insufficient antenatal care, unspecified trimester: Secondary | ICD-10-CM

## 2017-03-21 ENCOUNTER — Encounter (INDEPENDENT_AMBULATORY_CARE_PROVIDER_SITE_OTHER): Payer: Self-pay

## 2017-03-21 ENCOUNTER — Ambulatory Visit (INDEPENDENT_AMBULATORY_CARE_PROVIDER_SITE_OTHER): Payer: Medicaid Other | Admitting: Advanced Practice Midwife

## 2017-03-21 ENCOUNTER — Ambulatory Visit (INDEPENDENT_AMBULATORY_CARE_PROVIDER_SITE_OTHER): Payer: Medicaid Other

## 2017-03-21 ENCOUNTER — Encounter: Payer: Self-pay | Admitting: Advanced Practice Midwife

## 2017-03-21 VITALS — BP 110/70 | HR 80 | Wt 162.0 lb

## 2017-03-21 DIAGNOSIS — O0932 Supervision of pregnancy with insufficient antenatal care, second trimester: Secondary | ICD-10-CM | POA: Diagnosis not present

## 2017-03-21 DIAGNOSIS — F1111 Opioid abuse, in remission: Secondary | ICD-10-CM

## 2017-03-21 DIAGNOSIS — Z1389 Encounter for screening for other disorder: Secondary | ICD-10-CM

## 2017-03-21 DIAGNOSIS — Z3490 Encounter for supervision of normal pregnancy, unspecified, unspecified trimester: Secondary | ICD-10-CM | POA: Insufficient documentation

## 2017-03-21 DIAGNOSIS — Z3402 Encounter for supervision of normal first pregnancy, second trimester: Secondary | ICD-10-CM

## 2017-03-21 DIAGNOSIS — Z331 Pregnant state, incidental: Secondary | ICD-10-CM | POA: Diagnosis not present

## 2017-03-21 DIAGNOSIS — Z3A25 25 weeks gestation of pregnancy: Secondary | ICD-10-CM

## 2017-03-21 DIAGNOSIS — O0930 Supervision of pregnancy with insufficient antenatal care, unspecified trimester: Secondary | ICD-10-CM

## 2017-03-21 DIAGNOSIS — L02224 Furuncle of groin: Secondary | ICD-10-CM | POA: Diagnosis not present

## 2017-03-21 DIAGNOSIS — Z23 Encounter for immunization: Secondary | ICD-10-CM | POA: Diagnosis not present

## 2017-03-21 DIAGNOSIS — O99322 Drug use complicating pregnancy, second trimester: Secondary | ICD-10-CM | POA: Diagnosis not present

## 2017-03-21 DIAGNOSIS — B356 Tinea cruris: Secondary | ICD-10-CM

## 2017-03-21 DIAGNOSIS — F1110 Opioid abuse, uncomplicated: Secondary | ICD-10-CM | POA: Insufficient documentation

## 2017-03-21 DIAGNOSIS — Z363 Encounter for antenatal screening for malformations: Secondary | ICD-10-CM

## 2017-03-21 DIAGNOSIS — Z1379 Encounter for other screening for genetic and chromosomal anomalies: Secondary | ICD-10-CM

## 2017-03-21 DIAGNOSIS — O98812 Other maternal infectious and parasitic diseases complicating pregnancy, second trimester: Secondary | ICD-10-CM

## 2017-03-21 DIAGNOSIS — Z3482 Encounter for supervision of other normal pregnancy, second trimester: Secondary | ICD-10-CM

## 2017-03-21 HISTORY — DX: Encounter for supervision of normal pregnancy, unspecified, unspecified trimester: Z34.90

## 2017-03-21 LAB — POCT URINALYSIS DIPSTICK
GLUCOSE UA: NEGATIVE
Ketones, UA: NEGATIVE
LEUKOCYTES UA: NEGATIVE
NITRITE UA: NEGATIVE
Protein, UA: NEGATIVE
RBC UA: NEGATIVE

## 2017-03-21 MED ORDER — SULFAMETHOXAZOLE-TRIMETHOPRIM 800-160 MG PO TABS: 1.0000 | ORAL_TABLET | Freq: Two times a day (BID) | ORAL | 0 refills | Status: DC

## 2017-03-21 NOTE — Progress Notes (Signed)
Subjective:    Marisa Kirby is a [id number] [redacted]w[redacted]d being seen today for her first obstetrical visit.  Her obstetrical history is significant for two. Term SVDs, 3 1st trimestrer SABs and 3 early TABs.  Has a hx of narcotic addiction, went to rehab "a few years ago" and denies using now.   Pregnancy history fully reviewed. No PNC so far, no real reason.  Patient reports ringworm rash for several months.  .Has a boil in left groin for a few days.    [place] 25+1 wks,breech,cx 3.1 cm,post pl gr 0,fhr 150 bpm,afi 11.7 cm,efw 826 g,anatomy complete,no obvious abnormalities  Vitals:   03/21/17 1447  BP: 110/70  Pulse: 80  Weight: 162 lb (73.5 kg)    HISTORY: OB History  Gravida Para Term Preterm AB Living  9 2 2   6 2   SAB TAB Ectopic Multiple Live Births  3 3     2     # Outcome Date GA Lbr Len/2nd Weight Sex Delivery Anes PTL Lv  9 Current           8 SAB 2018          7 SAB 2017          6 TAB 2016          5 TAB 2013          4 Term 03/13/03 [redacted]w[redacted]d  8 lb (3.629 kg) F Vag-Spont EPI N LIV  3 Term 01/01/99 [redacted]w[redacted]d  8 lb (3.629 kg) M Vag-Spont EPI N LIV  2 SAB 1999          1 TAB 1995             Past Medical History:  Diagnosis Date  . Kidney stones   . Substance abuse [facility])    Past Surgical History:  Procedure Laterality Date  . LITHOTRIPSY    . STENT PLACE LEFT URETER ([facility] HX)    . STENT REMOVAL    . TONSILLECTOMY     Family History  Problem Relation Age of Onset  . Adopted: Yes  . Kidney disease Mother   . Heart disease Mother   . Obesity Mother   . Depression Mother   . Congestive Heart Failure Maternal Grandmother      Exam                                      System:     Skin: Rash c/w ringworm in left groin and onto abdomen. Also has 2cm firm furuncle on L groin, not ready to drain.    Neurologic: oriented, normal, normal mood   Extremities: normal strength, tone, and muscle mass   HEENT PERRLA   Mouth/Teeth mucous membranes moist, normal dentition    Neck supple and no masses   Cardiovascular: regular rate and rhythm   Respiratory:  appears well, vitals normal, no respiratory distress, acyanotic   Abdomen: soft, non-tender;  FHR: 150        The nature of [facility] with multiple MDs and other Advanced Practice Providers was explained to patient; also emphasized that residents, students are part of our team.  Assessment:    Pregnancy: [id number] Patient Active Problem List   Diagnosis Date Noted  . Supervision of normal pregnancy 03/21/2017  . Narcotic abuse (H[facility]) 03/21/2017  . Late prenatal care in second trimester 03/21/2017  Plan:     Initial labs drawn. Continue prenatal vitamins  Antifungal cream/gentian violet to rash BID until rash is gone, then for one additional week Septra/boil eze/warm compressess for boil Problem list reviewed and updated   Reviewed recommended weight gain based on pre-gravid BMI  Encouraged well-balanced diet Genetic Screening discussed : too late.  Ultrasound discussed; fetal survey: results reviewed.  Return in about 2 weeks (around 04/04/2017) for PN2/LROB.  CRESENZO-DISHMAN,Cai Flott 03/22/2017

## 2017-03-21 NOTE — Progress Notes (Signed)
US 25+1 wks,breech,cx 3.1 cm,post pl gr 0,fhr 150 bpm,afi 11.7 cm,efw 826 g,anatomy complete,no obvious abnormalities

## 2017-03-21 NOTE — Patient Instructions (Addendum)
Safe Medications in Pregnancy   Acne: Benzoyl Peroxide Salicylic Acid  Backache/Headache: Tylenol: 2 regular strength every 4 hours OR              2 Extra strength every 6 hours  Colds/Coughs/Allergies: Benadryl (alcohol free) 25 mg every 6 hours as needed Breath right strips Claritin Cepacol throat lozenges Chloraseptic throat spray Cold-Eeze- up to three times per day Cough drops, alcohol free Flonase (by prescription only) Guaifenesin Mucinex Robitussin DM (plain only, alcohol free) Saline nasal spray/drops Sudafed (pseudoephedrine) & Actifed ** use only after [redacted] weeks gestation and if you do not have high blood pressure Tylenol Vicks Vaporub Zinc lozenges Zyrtec   Constipation: Colace Ducolax suppositories Fleet enema Glycerin suppositories Metamucil Milk of magnesia Miralax Senokot Smooth move tea  Diarrhea: Kaopectate Imodium A-D  *NO pepto Bismol  Hemorrhoids: Anusol Anusol HC Preparation H Tucks  Indigestion: Tums Maalox Mylanta Zantac  Pepcid  Insomnia: Benadryl (alcohol free) 25mg every 6 hours as needed Tylenol PM Unisom, no Gelcaps  Leg Cramps: Tums MagGel  Nausea/Vomiting:  Bonine Dramamine Emetrol Ginger extract Sea bands Meclizine  Nausea medication to take during pregnancy:  Unisom (doxylamine succinate 25 mg tablets) Take one tablet daily at bedtime. If symptoms are not adequately controlled, the dose can be increased to a maximum recommended dose of two tablets daily (1/2 tablet in the morning, 1/2 tablet mid-afternoon and one at bedtime). Vitamin B6 100mg tablets. Take one tablet twice a day (up to 200 mg per day).  Skin Rashes: Aveeno products Benadryl cream or 25mg every 6 hours as needed Calamine Lotion 1% cortisone cream  Yeast infection: Gyne-lotrimin 7 Monistat 7   **If taking multiple medications, please check labels to avoid duplicating the same active ingredients **take medication as directed on  the label ** Do not exceed 4000 mg of tylenol in 24 hours **Do not take medications that contain aspirin or ibuprofen   1. Before your test, do not eat or drink anything for 8-10 hours prior to your  appointment (a small amount of water is allowed and you may take any medicines you normally take). Be sure to drink lots of water the day before. 2. When you arrive, your blood will be drawn for a 'fasting' blood sugar level.  Then you will be given a sweetened carbonated beverage to drink. You should  complete drinking this beverage within five minutes. After finishing the  beverage, you will have your blood drawn exactly 1 and 2 hours later. Having  your blood drawn on time is an important part of this test. A total of three blood  samples will be done. 3. The test takes approximately 2  hours. During the test, do not have anything to  eat or drink. Do not smoke, chew gum (not even sugarless gum) or use breath mints.  4. During the test you should remain close by and seated as much as possible and  avoid walking around. You may want to bring a book or something else to  occupy your time.  5. After your test, you may eat and drink as normal. You may want to bring a snack  to eat after the test is finished. Your provider will advise you as to the results of  this test and any follow-up if necessary  If your sugar test is positive for gestational diabetes, you will be given an phone call and further instructions discussed. If you wish to know all of your test results before your next   next appointment, feel free to call the office, or look up your test results on Mychart.  (The range that the lab uses for normal values of the sugar test are not necessarily the range that is used for pregnant women; if your results are within the normal range, they are definitely normal.  However, if a value is deemed "high" by the lab, it may not be too high for a pregnant woman.  We will need to discuss the results if  your value(s) fall in the "high" category).     Tdap Vaccine  It is recommended that you get the Tdap vaccine during the third trimester of EACH pregnancy to help protect your baby from getting pertussis (whooping cough)  27-36 weeks is the BEST time to do this so that you can pass the protection on to your baby. During pregnancy is better than after pregnancy, but if you are unable to get it during pregnancy it will be offered at the hospital.  You can get this vaccine at the health department or your family doctor, as well as some pharmacies.  Everyone who will be around your baby should also be up-to-date on their vaccines. Adults (who are not pregnant) only need 1 dose of Tdap during adulthood.     Gentian Violet for ringworm Boil Eze

## 2017-03-22 ENCOUNTER — Encounter: Payer: Self-pay | Admitting: Advanced Practice Midwife

## 2017-03-22 DIAGNOSIS — F1910 Other psychoactive substance abuse, uncomplicated: Secondary | ICD-10-CM | POA: Insufficient documentation

## 2017-03-22 LAB — CBC
HEMOGLOBIN: 11.6 g/dL (ref 11.1–15.9)
Hematocrit: 35.8 % (ref 34.0–46.6)
MCH: 30.2 pg (ref 26.6–33.0)
MCHC: 32.4 g/dL (ref 31.5–35.7)
MCV: 93 fL (ref 79–97)
PLATELETS: 396 10*3/uL — AB (ref 150–379)
RBC: 3.84 x10E6/uL (ref 3.77–5.28)
RDW: 13.5 % (ref 12.3–15.4)
WBC: 10.2 10*3/uL (ref 3.4–10.8)

## 2017-03-22 LAB — PMP SCREEN PROFILE (10S), URINE
AMPHETAMINE SCREEN URINE: POSITIVE ng/mL — AB
BARBITURATE SCREEN URINE: NEGATIVE ng/mL
BENZODIAZEPINE SCREEN, URINE: POSITIVE ng/mL — AB
CANNABINOIDS UR QL SCN: POSITIVE ng/mL — AB
COCAINE(METAB.)SCREEN, URINE: POSITIVE ng/mL — AB
Creatinine(Crt), U: 393.1 mg/dL — ABNORMAL HIGH (ref 20.0–300.0)
METHADONE SCREEN, URINE: NEGATIVE ng/mL
OPIATE SCREEN URINE: NEGATIVE ng/mL
OXYCODONE+OXYMORPHONE UR QL SCN: NEGATIVE ng/mL
PHENCYCLIDINE QUANTITATIVE URINE: NEGATIVE ng/mL
PROPOXYPHENE SCREEN URINE: NEGATIVE ng/mL
Ph of Urine: 5.9 (ref 4.5–8.9)

## 2017-03-22 LAB — ANTIBODY SCREEN: ANTIBODY SCREEN: NEGATIVE

## 2017-03-22 LAB — HIV ANTIBODY (ROUTINE TESTING W REFLEX): HIV Screen 4th Generation wRfx: NONREACTIVE

## 2017-03-22 LAB — HEPATITIS B SURFACE ANTIGEN: HEP B S AG: NEGATIVE

## 2017-03-23 LAB — GC/CHLAMYDIA PROBE AMP
CHLAMYDIA, DNA PROBE: NEGATIVE
Neisseria gonorrhoeae by PCR: NEGATIVE

## 2017-04-02 ENCOUNTER — Telehealth: Payer: Self-pay | Admitting: Women's Health

## 2017-04-02 NOTE — Telephone Encounter (Signed)
Called patient back and she states that she has constipation. She wanted to know what suppositories are safe to take. I advised that she can use dulcolax or glycerin suppositories. Patient voiced understanding and had no further questions or concerns.

## 2017-04-05 ENCOUNTER — Other Ambulatory Visit: Payer: Medicaid Other

## 2017-04-05 ENCOUNTER — Encounter: Payer: Medicaid Other | Admitting: Women's Health

## 2017-04-09 ENCOUNTER — Other Ambulatory Visit: Payer: Medicaid Other

## 2017-04-09 DIAGNOSIS — Z131 Encounter for screening for diabetes mellitus: Secondary | ICD-10-CM

## 2017-04-09 DIAGNOSIS — Z3A27 27 weeks gestation of pregnancy: Secondary | ICD-10-CM

## 2017-04-09 DIAGNOSIS — Z3482 Encounter for supervision of other normal pregnancy, second trimester: Secondary | ICD-10-CM

## 2017-04-10 LAB — CBC
HEMATOCRIT: 33.3 % — AB (ref 34.0–46.6)
HEMOGLOBIN: 11.1 g/dL (ref 11.1–15.9)
MCH: 30.6 pg (ref 26.6–33.0)
MCHC: 33.3 g/dL (ref 31.5–35.7)
MCV: 92 fL (ref 79–97)
Platelets: 332 10*3/uL (ref 150–379)
RBC: 3.63 x10E6/uL — ABNORMAL LOW (ref 3.77–5.28)
RDW: 13.3 % (ref 12.3–15.4)
WBC: 9 10*3/uL (ref 3.4–10.8)

## 2017-04-10 LAB — GLUCOSE TOLERANCE, 2 HOURS W/ 1HR
GLUCOSE, FASTING: 87 mg/dL (ref 65–91)
Glucose, 1 hour: 108 mg/dL (ref 65–179)
Glucose, 2 hour: 103 mg/dL (ref 65–152)

## 2017-04-10 LAB — ANTIBODY SCREEN: Antibody Screen: NEGATIVE

## 2017-04-10 LAB — RPR: RPR: NONREACTIVE

## 2017-04-10 LAB — HIV ANTIBODY (ROUTINE TESTING W REFLEX): HIV SCREEN 4TH GENERATION: NONREACTIVE

## 2017-04-16 ENCOUNTER — Encounter: Payer: Medicaid Other | Admitting: Women's Health

## 2017-05-03 ENCOUNTER — Inpatient Hospital Stay (HOSPITAL_COMMUNITY)
Admission: AD | Admit: 2017-05-03 | Discharge: 2017-05-04 | Disposition: A | Discharge status: Home / Self Care | Payer: Medicaid Other | Source: Ambulatory Visit | Attending: Obstetrics and Gynecology | Admitting: Obstetrics and Gynecology

## 2017-05-03 ENCOUNTER — Encounter (HOSPITAL_COMMUNITY): Payer: Self-pay | Admitting: Emergency Medicine

## 2017-05-03 DIAGNOSIS — O479 False labor, unspecified: Secondary | ICD-10-CM | POA: Diagnosis not present

## 2017-05-03 DIAGNOSIS — B9689 Other specified bacterial agents as the cause of diseases classified elsewhere: Secondary | ICD-10-CM | POA: Diagnosis not present

## 2017-05-03 DIAGNOSIS — O98813 Other maternal infectious and parasitic diseases complicating pregnancy, third trimester: Secondary | ICD-10-CM | POA: Insufficient documentation

## 2017-05-03 DIAGNOSIS — O99333 Smoking (tobacco) complicating pregnancy, third trimester: Secondary | ICD-10-CM | POA: Diagnosis not present

## 2017-05-03 DIAGNOSIS — O23593 Infection of other part of genital tract in pregnancy, third trimester: Secondary | ICD-10-CM | POA: Insufficient documentation

## 2017-05-03 DIAGNOSIS — N760 Acute vaginitis: Secondary | ICD-10-CM

## 2017-05-03 DIAGNOSIS — B373 Candidiasis of vulva and vagina: Secondary | ICD-10-CM | POA: Insufficient documentation

## 2017-05-03 DIAGNOSIS — F17210 Nicotine dependence, cigarettes, uncomplicated: Secondary | ICD-10-CM | POA: Insufficient documentation

## 2017-05-03 DIAGNOSIS — B3749 Other urogenital candidiasis: Secondary | ICD-10-CM

## 2017-05-03 DIAGNOSIS — Z3A31 31 weeks gestation of pregnancy: Secondary | ICD-10-CM | POA: Insufficient documentation

## 2017-05-03 LAB — URINALYSIS, ROUTINE W REFLEX MICROSCOPIC
BILIRUBIN URINE: NEGATIVE
Glucose, UA: NEGATIVE mg/dL
Hgb urine dipstick: NEGATIVE
Ketones, ur: NEGATIVE mg/dL
NITRITE: NEGATIVE
PROTEIN: NEGATIVE mg/dL
SPECIFIC GRAVITY, URINE: 1.002 — AB (ref 1.005–1.030)
pH: 7 (ref 5.0–8.0)

## 2017-05-03 LAB — WET PREP, GENITAL
Sperm: NONE SEEN
Trich, Wet Prep: NONE SEEN
YEAST WET PREP: NONE SEEN

## 2017-05-03 LAB — FETAL FIBRONECTIN: FETAL FIBRONECTIN: NEGATIVE

## 2017-05-03 MED ORDER — METRONIDAZOLE 500 MG PO TABS: 500.0000 mg | ORAL_TABLET | Freq: Two times a day (BID) | ORAL | 0 refills | Status: AC

## 2017-05-03 MED ORDER — LACTATED RINGERS IV BOLUS (SEPSIS)
1000.0000 mL | Freq: Once | INTRAVENOUS | Status: AC
  Administered 2017-05-03: 1000 mL via INTRAVENOUS

## 2017-05-03 MED ORDER — FLUCONAZOLE 150 MG PO TABS: ORAL_TABLET | ORAL | 0 refills | Status: DC

## 2017-05-03 MED ORDER — BETAMETHASONE SOD PHOS & ACET 6 (3-3) MG/ML IJ SUSP
12.0000 mg | Freq: Once | INTRAMUSCULAR | Status: AC
  Administered 2017-05-03: 12 mg via INTRAMUSCULAR
  Filled 2017-05-03: qty 2

## 2017-05-03 NOTE — MAU Provider Note (Signed)
Chief Complaint:  Contractions   None     HPI: Marisa BannisterVickie R Kirby is a 36 y.o. [id number] at [redacted]w[redacted]d who presents to MAU reporting contraction pain that started around 7 PM tonight.  She states that she feels tightening of her abdomen.  Worse with certain movements such as sitting up.  Patient denies any history of preterm deliveries.  She states that she has not had intercourse in the last couple of days.  She denies vaginal bleeding, vaginal discharge, or loss of fluid. Good fetal movement.   Pregnancy Course:  [facility] History of polysubstance abuse  Past Medical History: Past Medical History:  Diagnosis Date  . Kidney stones   . Substance abuse (HCC)     Past obstetric history: OB History  Gravida Para Term Preterm AB Living  9 2 2   6 2   SAB TAB Ectopic Multiple Live Births  3 3     2     # Outcome Date GA Lbr Len/2nd Weight Sex Delivery Anes PTL Lv  9 Current           8 SAB 2018          7 SAB 2017          6 TAB 2016          5 TAB 2013          4 Term 03/13/03 6145w0d  3.629 kg (8 lb) F Vag-Spont EPI N LIV  3 Term 01/01/99 4145w0d  3.629 kg (8 lb) M Vag-Spont EPI N LIV  2 SAB 1999          1 TAB 1995              Past Surgical History: Past Surgical History:  Procedure Laterality Date  . LITHOTRIPSY    . STENT PLACE LEFT URETER ([facility] HX)    . STENT REMOVAL    . TONSILLECTOMY       Family History: Family History  Adopted: Yes  Problem Relation Age of Onset  . Kidney disease Mother   . Heart disease Mother   . Obesity Mother   . Depression Mother   . Congestive Heart Failure Maternal Grandmother     Social History: Social History   Tobacco Use  . Smoking status: Current Every Day Smoker    Packs/day: 0.25    Types: Cigarettes  . Smokeless tobacco: Never Used  . Tobacco comment: 5 cigs per day  Substance Use Topics  . Alcohol use: No    Comment: quit drinking in 2016  . Drug use: Yes    Types: Marijuana     Comment: pain pills- took morphine by mouth and snorted morphine    Allergies:  Allergies  Allergen Reactions  . Tramadol Hives    Meds:  Medications Prior to Admission  Medication Sig Dispense Refill Last Dose  . acetaminophen (TYLENOL) 500 MG tablet Take 1,000 mg by mouth every 6 (six) hours as needed for mild pain or headache.   Taking  . Melatonin 1 MG TABS Take by mouth.   Taking  . Prenatal Vit-Fe Fumarate-FA (PRENATAL MULTIVITAMIN) TABS tablet Take 1 tablet by mouth daily at 12 noon.   Taking  . promethazine (PHENERGAN) 25 MG tablet Take 1 tablet (25 mg total) by mouth every 6 (six) hours as needed for nausea or vomiting. (Patient not taking: Reported on 01/25/2016) 30 tablet 0 Not Taking  . sulfamethoxazole-trimethoprim (BACTRIM DS,SEPTRA DS) 800-160  MG tablet Take 1 tablet by mouth 2 (two) times daily. 14 tablet 0     I have reviewed patient's Past Medical Hx, Surgical Hx, Family Hx, Social Hx, medications and allergies.   ROS:  All systems reviewed and are negative for acute change except as noted in the HPI.   Physical Exam   Patient Vitals for the past 24 hrs:  BP Temp Temp src Pulse Resp SpO2  05/03/17 2205 (!) 95/56 - - 85 - -  05/03/17 2203 - 97.7 F (36.5 C) Oral - 18 98 %   Constitutional: Well-developed, well-nourished female in no acute distress.  Cardiovascular: normal rate and rhythm, pulses intact Respiratory: normal rate and effort.  GI: Abd soft, non-tender, gravid appropriate for gestational age.  MS: Extremities nontender, no edema, normal ROM Neurologic: Alert and oriented x 4.  GU: Neg CVAT. Pelvic: NEFG, discharge, no blood, cervix clean. No CMT Psych: normal mood and affect  Dilation: 2 Effacement (%): 50 Station: -3   Labs: Results for orders placed or performed during the hospital encounter of 05/03/17 (from the past 24 hour(s))  Urinalysis, Routine w reflex microscopic     Status: Abnormal   Collection Time: 05/03/17  9:21 PM   Result Value Ref Range   Color, Urine STRAW (A) YELLOW   APPearance HAZY (A) CLEAR   Specific Gravity, Urine 1.002 (L) 1.005 - 1.030   pH 7.0 5.0 - 8.0   Glucose, UA NEGATIVE NEGATIVE mg/dL   Hgb urine dipstick NEGATIVE NEGATIVE   Bilirubin Urine NEGATIVE NEGATIVE   Ketones, ur NEGATIVE NEGATIVE mg/dL   Protein, ur NEGATIVE NEGATIVE mg/dL   Nitrite NEGATIVE NEGATIVE   Leukocytes, UA MODERATE (A) NEGATIVE   RBC / HPF 6-30 0 - 5 RBC/hpf   WBC, UA 0-5 0 - 5 WBC/hpf   Bacteria, UA RARE (A) NONE SEEN   Squamous Epithelial / LPF 6-30 (A) NONE SEEN  Fetal fibronectin     Status: None   Collection Time: 05/03/17 10:30 PM  Result Value Ref Range   Fetal Fibronectin NEGATIVE NEGATIVE  Wet prep, genital     Status: Abnormal   Collection Time: 05/03/17 10:30 PM  Result Value Ref Range   Yeast Wet Prep HPF POC NONE SEEN NONE SEEN   Trich, Wet Prep NONE SEEN NONE SEEN   Clue Cells Wet Prep HPF POC PRESENT (A) NONE SEEN   WBC, Wet Prep HPF POC MODERATE (A) NONE SEEN   Sperm NONE SEEN     Imaging:  No results found.  MAU Course: Vitals and nursing notes reviewed I have ordered labs and reviewed them Wet prep with BV Noticeable extensive cutaneous candidiasis infection of genital area FFN negative Cervical exam unchanged after an hour Treatments given in MAU: IV fluid bolus, BMZ  I personally reviewed the patient's NST today, found to be REACTIVE. 120 bpm, mod var, +accels, no decels. CTX: irregualr   MDM: Plan of care reviewed with patient, including labs and tests ordered and medical treatment.   Assessment: 1. Braxton Hick's contraction   2. BV (bacterial vaginosis)   3. Candida infection of genital region     Plan: Discharge home in stable condition.  Rx for Flagyl, fluconazole, and ketoconazole cream Preterm labor precautions and fetal kick counts reviewed Follow-up in 24hrs for second dose of BMZ Handout given Follow-up with OB provider   Caryl AdaJazma Birt Reinoso, DO OB  [facility], [facility] 05/03/2017 10:16 PM

## 2017-05-03 NOTE — MAU Note (Signed)
Urine is in the Lab 

## 2017-05-03 NOTE — MAU Note (Signed)
Pt c/o ctxs that started all of a sudden around 1900. Pt tried getting into the bathtub to help but it didn't help stop the tightening.  Denies LOF & bleeding.  +FM

## 2017-05-03 NOTE — Discharge Instructions (Signed)
Abdominal Pain During Pregnancy Belly (abdominal) pain is common during pregnancy. Most of the time, it is not a serious problem. Other times, it can be a sign that something is wrong with the pregnancy. Always tell your doctor if you have belly pain. Follow these instructions at home: Monitor your belly pain for any changes. The following actions may help you feel better:  Do not have sex (intercourse) or put anything in your vagina until you feel better.  Rest until your pain stops.  Drink clear fluids if you feel sick to your stomach (nauseous). Do not eat solid food until you feel better.  Only take medicine as told by your doctor.  Keep all doctor visits as told.  Get help right away if:  You are bleeding, leaking fluid, or pieces of tissue come out of your vagina.  You have more pain or cramping.  You keep throwing up (vomiting).  You have pain when you pee (urinate) or have blood in your pee.  You have a fever.  You do not feel your baby moving as much.  You feel very weak or feel like passing out.  You have trouble breathing, with or without belly pain.  You have a very bad headache and belly pain.  You have fluid leaking from your vagina and belly pain.  You keep having watery poop (diarrhea).  Your belly pain does not go away after resting, or the pain gets worse. This information is not intended to replace advice given to you by your health care provider. Make sure you discuss any questions you have with your health care provider. Document Released: 04/26/2009 Document Revised: 12/15/2015 Document Reviewed: 12/05/2012 Elsevier Interactive Patient Education  2018 [employer]   Bacterial Vaginosis Bacterial vaginosis is an infection of the vagina. It happens when too many germs (bacteria) grow in the vagina. This infection puts you at risk for infections from sex (STIs). Treating this infection can lower your risk for some STIs. You should also treat this if  you are pregnant. It can cause your baby to be born early. Follow these instructions at home: Medicines  Take over-the-counter and prescription medicines only as told by your doctor.  Take or use your antibiotic medicine as told by your doctor. Do not stop taking or using it even if you start to feel better. General instructions  If you your sexual partner is a woman, tell her that you have this infection. She needs to get treatment if she has symptoms. If you have a female partner, he does not need to be treated.  During treatment: ? Avoid sex. ? Do not douche. ? Avoid alcohol as told. ? Avoid breastfeeding as told.  Drink enough fluid to keep your pee (urine) clear or pale yellow.  Keep your vagina and butt (rectum) clean. ? Wash the area with warm water every day. ? Wipe from front to back after you use the toilet.  Keep all follow-up visits as told by your doctor. This is important. Preventing this condition  Do not douche.  Use only warm water to wash around your vagina.  Use protection when you have sex. This includes: ? Latex condoms. ? Dental dams.  Limit how many people you have sex with. It is best to only have sex with the same person (be monogamous).  Get tested for STIs. Have your partner get tested.  Wear underwear that is cotton or lined with cotton.  Avoid tight pants and pantyhose. This is most important  in summer.  Do not use any products that have nicotine or tobacco in them. These include cigarettes and e-cigarettes. If you need help quitting, ask your doctor.  Do not use illegal drugs.  Limit how much alcohol you drink. Contact a doctor if:  Your symptoms do not get better, even after you are treated.  You have more discharge or pain when you pee (urinate).  You have a fever.  You have pain in your belly (abdomen).  You have pain with sex.  Your bleed from your vagina between periods. Summary  This infection happens when too many germs  (bacteria) grow in the vagina.  Treating this condition can lower your risk for some infections from sex (STIs).  You should also treat this if you are pregnant. It can cause early (premature) birth.  Do not stop taking or using your antibiotic medicine even if you start to feel better. This information is not intended to replace advice given to you by your health care provider. Make sure you discuss any questions you have with your health care provider. Document Released: 02/15/2008 Document Revised: 01/22/2016 Document Reviewed: 01/22/2016 Elsevier Interactive Patient Education  2017 [employer]

## 2017-05-04 MED ORDER — KETOCONAZOLE 2 % EX CREA: 1.0000 "application " | TOPICAL_CREAM | Freq: Two times a day (BID) | CUTANEOUS | 0 refills | Status: DC

## 2017-05-07 ENCOUNTER — Encounter: Payer: Self-pay | Admitting: Obstetrics & Gynecology

## 2017-05-07 ENCOUNTER — Ambulatory Visit (INDEPENDENT_AMBULATORY_CARE_PROVIDER_SITE_OTHER): Payer: Medicaid Other | Admitting: Obstetrics & Gynecology

## 2017-05-07 ENCOUNTER — Other Ambulatory Visit: Payer: Self-pay

## 2017-05-07 VITALS — BP 112/74 | HR 84 | Wt 170.0 lb

## 2017-05-07 DIAGNOSIS — Z3483 Encounter for supervision of other normal pregnancy, third trimester: Secondary | ICD-10-CM

## 2017-05-07 DIAGNOSIS — Z3A31 31 weeks gestation of pregnancy: Secondary | ICD-10-CM

## 2017-05-07 DIAGNOSIS — Z331 Pregnant state, incidental: Secondary | ICD-10-CM | POA: Diagnosis not present

## 2017-05-07 DIAGNOSIS — Z1389 Encounter for screening for other disorder: Secondary | ICD-10-CM | POA: Diagnosis not present

## 2017-05-07 DIAGNOSIS — O99323 Drug use complicating pregnancy, third trimester: Secondary | ICD-10-CM | POA: Diagnosis not present

## 2017-05-07 DIAGNOSIS — F1910 Other psychoactive substance abuse, uncomplicated: Secondary | ICD-10-CM | POA: Diagnosis not present

## 2017-05-07 LAB — POCT URINALYSIS DIPSTICK
Blood, UA: NEGATIVE
Glucose, UA: NEGATIVE
Ketones, UA: NEGATIVE
LEUKOCYTES UA: NEGATIVE
NITRITE UA: NEGATIVE
PROTEIN UA: NEGATIVE

## 2017-05-07 NOTE — Progress Notes (Signed)
[id number] 883w6d Estimated Date of Delivery: 07/03/17  Blood pressure 112/74, pulse 84, weight 170 lb (77.1 kg), last menstrual period 09/03/2016.   BP weight and urine results all reviewed and noted.  Please refer to the obstetrical flow sheet for the fundal height and fetal heart rate documentation:  Patient reports good fetal movement, denies any bleeding and no rupture of membranes symptoms or regular contractions. Patient is without complaints. All questions were answered.  Orders Placed This Encounter  Procedures  . POCT urinalysis dipstick    Plan:  Continued routine obstetrical care,  Poor transportation issues  Return in about 2 weeks (around 05/21/2017) for LROB.

## 2017-05-21 ENCOUNTER — Ambulatory Visit (INDEPENDENT_AMBULATORY_CARE_PROVIDER_SITE_OTHER): Payer: Medicaid Other | Admitting: Obstetrics & Gynecology

## 2017-05-21 ENCOUNTER — Encounter: Payer: Self-pay | Admitting: Obstetrics & Gynecology

## 2017-05-21 VITALS — BP 100/60 | HR 95 | Wt 173.0 lb

## 2017-05-21 DIAGNOSIS — Z3A33 33 weeks gestation of pregnancy: Secondary | ICD-10-CM | POA: Diagnosis not present

## 2017-05-21 DIAGNOSIS — Z1389 Encounter for screening for other disorder: Secondary | ICD-10-CM | POA: Diagnosis not present

## 2017-05-21 DIAGNOSIS — F1910 Other psychoactive substance abuse, uncomplicated: Secondary | ICD-10-CM

## 2017-05-21 DIAGNOSIS — Z3483 Encounter for supervision of other normal pregnancy, third trimester: Secondary | ICD-10-CM

## 2017-05-21 DIAGNOSIS — Z331 Pregnant state, incidental: Secondary | ICD-10-CM | POA: Diagnosis not present

## 2017-05-21 DIAGNOSIS — O99323 Drug use complicating pregnancy, third trimester: Secondary | ICD-10-CM | POA: Diagnosis not present

## 2017-05-21 LAB — POCT URINALYSIS DIPSTICK
Glucose, UA: NEGATIVE
Ketones, UA: NEGATIVE
Leukocytes, UA: NEGATIVE
NITRITE UA: NEGATIVE
PROTEIN UA: NEGATIVE
RBC UA: NEGATIVE

## 2017-05-21 NOTE — Progress Notes (Signed)
[id number] 6334w6d Estimated Date of Delivery: 07/03/17  Blood pressure 100/60, pulse 95, weight 173 lb (78.5 kg), last menstrual period 09/03/2016, unknown if currently breastfeeding.   BP weight and urine results all reviewed and noted.  Please refer to the obstetrical flow sheet for the fundal height and fetal heart rate documentation:  Patient reports good fetal movement, denies any bleeding and no rupture of membranes symptoms or regular contractions. Patient is without complaints. All questions were answered.  Orders Placed This Encounter  Procedures  . POCT urinalysis dipstick    Plan:  Continued routine obstetrical care,   Return in about 2 weeks (around 06/04/2017) for LROB.

## 2017-05-22 NOTE — L&D Delivery Note (Signed)
Patient is 37 y.o. [id number] 7866w3d admitted in active labor. AROM at 11:12am.  Prenatal course also complicated by polysubstance abuse.  Delivery Note At 11:30 AM a viable female was delivered via  (Presentation: LOA ).  APGAR: 8,9 ; weight  pending.   Placenta status: spontaneous, intact .  Cord: 3 vessel   Anesthesia:  Epidural  Episiotomy:  None  Lacerations: None  Est. Blood Loss (mL):  200cc   Head delivered LOA. Nuchal cord present, easily reduced prior to delivery of body. Shoulder and body delivered in usual fashion. Infant with spontaneous cry, placed on mother's abdomen, dried and bulb suctioned. Cord clamped x 2 after 1-minute delay, and cut by family member. Cord blood drawn. Placenta delivered spontaneously with gentle cord traction. Fundus firm with massage and Pitocin. Perineum inspected and found to have no laceration.   Mom to postpartum.  Baby to Couplet care / Skin to Skin.  Marisa Kirby 06/08/2017, 11:47 AM

## 2017-05-24 ENCOUNTER — Encounter: Payer: Self-pay | Admitting: Women's Health

## 2017-06-04 ENCOUNTER — Ambulatory Visit (INDEPENDENT_AMBULATORY_CARE_PROVIDER_SITE_OTHER): Payer: Medicaid Other | Admitting: Women's Health

## 2017-06-04 ENCOUNTER — Encounter: Payer: Self-pay | Admitting: Women's Health

## 2017-06-04 VITALS — BP 98/64 | HR 98 | Wt 175.0 lb

## 2017-06-04 DIAGNOSIS — F1910 Other psychoactive substance abuse, uncomplicated: Secondary | ICD-10-CM | POA: Diagnosis not present

## 2017-06-04 DIAGNOSIS — O9989 Other specified diseases and conditions complicating pregnancy, childbirth and the puerperium: Secondary | ICD-10-CM

## 2017-06-04 DIAGNOSIS — O99323 Drug use complicating pregnancy, third trimester: Secondary | ICD-10-CM | POA: Diagnosis not present

## 2017-06-04 DIAGNOSIS — Z3483 Encounter for supervision of other normal pregnancy, third trimester: Secondary | ICD-10-CM

## 2017-06-04 DIAGNOSIS — Z3A35 35 weeks gestation of pregnancy: Secondary | ICD-10-CM | POA: Diagnosis not present

## 2017-06-04 DIAGNOSIS — N898 Other specified noninflammatory disorders of vagina: Secondary | ICD-10-CM | POA: Diagnosis not present

## 2017-06-04 DIAGNOSIS — O26893 Other specified pregnancy related conditions, third trimester: Secondary | ICD-10-CM

## 2017-06-04 LAB — POCT WET PREP (WET MOUNT)
CLUE CELLS WET PREP WHIFF POC: NEGATIVE
Trichomonas Wet Prep HPF POC: ABSENT

## 2017-06-04 NOTE — Progress Notes (Signed)
LOW-RISK PREGNANCY VISIT Patient name: Marisa BannisterVickie R Privette MRN [id number]  Date of birth: [birth year] Chief Complaint:   Routine Prenatal Visit  History of Present Illness:   Marisa BannisterVickie R Dungee is a 37 y.o. [id number] female at 6811w6d with an Estimated Date of Delivery: 07/03/17 being seen today for ongoing management of a low-risk pregnancy.  Today she reports irregular uc's, discharge w/ slight odor- no itching/irritation. Discussed +UDS 10/31 for THC, cocaine, amphetamines and benzos- pt reports no cocaine since Oct, [facility], amphetamines were aderral and ritalin- reports she stopped both, benzos was ativan- states she only takes occ for nerves. Per DEA database search today she doesn't have rx's for any. Is currently on parole, has to go to courthouse today, requests note w/ current GA, EDC, and cervical exam-given. Plans to give this baby up for private adoption- adoptive parents Summer & Meredeth IdeBen Morrison. Unable to void, will try again before she leaves.   Contractions: Irregular. Vag. Bleeding: None.  Movement: Present. denies leaking of fluid. Review of Systems:   Pertinent items are noted in HPI Denies abnormal vaginal discharge w/ itching/odor/irritation, headaches, visual changes, shortness of breath, chest pain, abdominal pain, severe nausea/vomiting, or problems with urination or bowel movements unless otherwise stated above. Pertinent History Reviewed:  Reviewed past medical,surgical, social, obstetrical and family history.  Reviewed problem list, medications and allergies. Physical Assessment:   Vitals:   06/04/17 1222  BP: 98/64  Pulse: 98  Weight: 175 lb (79.4 kg)  Body mass index is 30.51 kg/m.        Physical Examination:   General appearance: Well appearing, and in no distress  Mental status: Alert, oriented to person, place, and time  Skin: Warm & dry  Cardiovascular: Normal heart rate noted  Respiratory: Normal respiratory effort, no distress  Abdomen: Soft, gravid,  nontender  Pelvic: Cervical exam performed         Extremities: Edema: Trace  Fetal Status: Fetal Heart Rate (bpm): 153 Fundal Height: 36 cm Movement: Present    Results for orders placed or performed in visit on 06/04/17 (from the past 24 hour(s))  POCT Wet Prep Mellody Drown(Wet [place])   Collection Time: 06/04/17  1:07 PM  Result Value Ref Range   Source Wet Prep POC vaginal    WBC, Wet Prep HPF POC mod    Bacteria Wet Prep HPF POC None (A) Few   BACTERIA WET PREP MORPHOLOGY POC     Clue Cells Wet Prep HPF POC None None   Clue Cells Wet Prep Whiff POC Negative Whiff    Yeast Wet Prep HPF POC None    KOH Wet Prep POC     Trichomonas Wet Prep HPF POC Absent Absent    Assessment & Plan:  1) Low-risk pregnancy [id number] at 5711w6d with an Estimated Date of Delivery: 07/03/17   2) Polysubstance abuse, advised abstinence from all, will send uds today  3) Plans adoption   Meds: No orders of the defined types were placed in this encounter.  Labs/procedures today: uds, gc/ct, wet prep, sve  Plan:  Continue routine obstetrical care   Reviewed: Preterm labor symptoms and general obstetric precautions including but not limited to vaginal bleeding, contractions, leaking of fluid and fetal movement were reviewed in detail with the patient.  All questions were answered  Follow-up: Return in about 1 week (around 06/11/2017) for LROB.  Orders Placed This Encounter  Procedures  . GC/Chlamydia Probe Amp  . Pain Management Screening Profile (10S)  . POCT Wet  Prep [facility] [place])   Marge Duncans CNM, [facility] 06/04/2017 1:07 PM

## 2017-06-04 NOTE — Patient Instructions (Signed)
Rhett BannisterVickie R Shiflet, I greatly value your feedback.  If you receive a survey following your visit with us today, we appreciate you taking the time to fill it out.  Thanks, Joellyn HaffKim Nghia Mcentee, CNM, WHNP-BC   Call the office [phone]) or go to [facility] if:  You begin to have strong, frequent contractions  Your water breaks.  Sometimes it is a big gush of fluid, sometimes it is just a trickle that keeps getting your panties wet or running down your legs  You have vaginal bleeding.  It is normal to have a small amount of spotting if your cervix was checked.   You don't feel your baby moving like normal.  If you don't, get you something to eat and drink and lay down and focus on feeling your baby move.  You should feel at least 10 movements in 2 hours.  If you don't, you should call the office or go to [facility].    Tdap Vaccine  It is recommended that you get the Tdap vaccine during the third trimester of EACH pregnancy to help protect your baby from getting pertussis (whooping cough)  27-36 weeks is the BEST time to do this so that you can pass the protection on to your baby. During pregnancy is better than after pregnancy, but if you are unable to get it during pregnancy it will be offered at the hospital.   You can get this vaccine at the health department or your family doctor  Everyone who will be around your baby should also be up-to-date on their vaccines. Adults (who are not pregnant) only need 1 dose of Tdap during adulthood.   Third Trimester of Pregnancy The third trimester is from week 29 through week 42, months 7 through 9. The third trimester is a time when the fetus is growing rapidly. At the end of the ninth month, the fetus is about 20 inches in length and weighs 6-10 pounds.  BODY CHANGES Your body goes through many changes during pregnancy. The changes vary from woman to woman.   Your weight will continue to increase. You can expect to gain 25-35 pounds (11-16 kg) by  the end of the pregnancy.  You may begin to get stretch marks on your hips, abdomen, and breasts.  You may urinate more often because the fetus is moving lower into your pelvis and pressing on your bladder.  You may develop or continue to have heartburn as a result of your pregnancy.  You may develop constipation because certain hormones are causing the muscles that push waste through your intestines to slow down.  You may develop hemorrhoids or swollen, bulging veins (varicose veins).  You may have pelvic pain because of the weight gain and pregnancy hormones relaxing your joints between the bones in your pelvis. Backaches may result from overexertion of the muscles supporting your posture.  You may have changes in your hair. These can include thickening of your hair, rapid growth, and changes in texture. Some women also have hair loss during or after pregnancy, or hair that feels dry or thin. Your hair will most likely return to normal after your baby is born.  Your breasts will continue to grow and be tender. A yellow discharge may leak from your breasts called colostrum.  Your belly button may stick out.  You may feel short of breath because of your expanding uterus.  You may notice the fetus "dropping," or moving lower in your abdomen.  You may have a bloody  mucus discharge. This usually occurs a few days to a week before labor begins.  Your cervix becomes thin and soft (effaced) near your due date. WHAT TO EXPECT AT YOUR PRENATAL EXAMS  You will have prenatal exams every 2 weeks until week 36. Then, you will have weekly prenatal exams. During a routine prenatal visit:  You will be weighed to make sure you and the fetus are growing normally.  Your blood pressure is taken.  Your abdomen will be measured to track your baby's growth.  The fetal heartbeat will be listened to.  Any test results from the previous visit will be discussed.  You may have a cervical check near your  due date to see if you have effaced. At around 36 weeks, your caregiver will check your cervix. At the same time, your caregiver will also perform a test on the secretions of the vaginal tissue. This test is to determine if a type of bacteria, Group B streptococcus, is present. Your caregiver will explain this further. Your caregiver may ask you:  What your birth plan is.  How you are feeling.  If you are feeling the baby move.  If you have had any abnormal symptoms, such as leaking fluid, bleeding, severe headaches, or abdominal cramping.  If you have any questions. Other tests or screenings that may be performed during your third trimester include:  Blood tests that check for low iron levels (anemia).  Fetal testing to check the health, activity level, and growth of the fetus. Testing is done if you have certain medical conditions or if there are problems during the pregnancy. FALSE LABOR You may feel small, irregular contractions that eventually go away. These are called Braxton Hicks contractions, or false labor. Contractions may last for hours, days, or even weeks before true labor sets in. If contractions come at regular intervals, intensify, or become painful, it is best to be seen by your caregiver.  SIGNS OF LABOR   Menstrual-like cramps.  Contractions that are 5 minutes apart or less.  Contractions that start on the top of the uterus and spread down to the lower abdomen and back.  A sense of increased pelvic pressure or back pain.  A watery or bloody mucus discharge that comes from the vagina. If you have any of these signs before the 37th week of pregnancy, call your caregiver right away. You need to go to the hospital to get checked immediately. HOME CARE INSTRUCTIONS   Avoid all smoking, herbs, alcohol, and unprescribed drugs. These chemicals affect the formation and growth of the baby.  Follow your caregiver's instructions regarding medicine use. There are medicines  that are either safe or unsafe to take during pregnancy.  Exercise only as directed by your caregiver. Experiencing uterine cramps is a good sign to stop exercising.  Continue to eat regular, healthy meals.  Wear a good support bra for breast tenderness.  Do not use hot tubs, steam rooms, or saunas.  Wear your seat belt at all times when driving.  Avoid raw meat, uncooked cheese, cat litter boxes, and soil used by cats. These carry germs that can cause birth defects in the baby.  Take your prenatal vitamins.  Try taking a stool softener (if your caregiver approves) if you develop constipation. Eat more high-fiber foods, such as fresh vegetables or fruit and whole grains. Drink plenty of fluids to keep your urine clear or pale yellow.  Take warm sitz baths to soothe any pain or discomfort caused by hemorrhoids.  Use hemorrhoid cream if your caregiver approves.  If you develop varicose veins, wear support hose. Elevate your feet for 15 minutes, 3-4 times a day. Limit salt in your diet.  Avoid heavy lifting, wear low heal shoes, and practice good posture.  Rest a lot with your legs elevated if you have leg cramps or low back pain.  Visit your dentist if you have not gone during your pregnancy. Use a soft toothbrush to brush your teeth and be gentle when you floss.  A sexual relationship may be continued unless your caregiver directs you otherwise.  Do not travel far distances unless it is absolutely necessary and only with the approval of your caregiver.  Take prenatal classes to understand, practice, and ask questions about the labor and delivery.  Make a trial run to the hospital.  Pack your hospital bag.  Prepare the baby's nursery.  Continue to go to all your prenatal visits as directed by your caregiver. SEEK MEDICAL CARE IF:  You are unsure if you are in labor or if your water has broken.  You have dizziness.  You have mild pelvic cramps, pelvic pressure, or nagging  pain in your abdominal area.  You have persistent nausea, vomiting, or diarrhea.  You have a bad smelling vaginal discharge.  You have pain with urination. SEEK IMMEDIATE MEDICAL CARE IF:   You have a fever.  You are leaking fluid from your vagina.  You have spotting or bleeding from your vagina.  You have severe abdominal cramping or pain.  You have rapid weight loss or gain.  You have shortness of breath with chest pain.  You notice sudden or extreme swelling of your face, hands, ankles, feet, or legs.  You have not felt your baby move in over an hour.  You have severe headaches that do not go away with medicine.  You have vision changes. Document Released: 05/02/2001 Document Revised: 05/13/2013 Document Reviewed: 07/09/2012 ExitCare Patient Information 2015 [employer]. This information is not intended to replace advice given to you by your health care provider. Make sure you discuss any questions you have with your health care provider.   

## 2017-06-07 ENCOUNTER — Other Ambulatory Visit: Payer: Self-pay | Admitting: Women's Health

## 2017-06-08 ENCOUNTER — Inpatient Hospital Stay (HOSPITAL_COMMUNITY): Payer: Medicaid Other | Admitting: Anesthesiology

## 2017-06-08 ENCOUNTER — Inpatient Hospital Stay (HOSPITAL_COMMUNITY)
Admission: AD | Admit: 2017-06-08 | Discharge: 2017-06-09 | DRG: 806 | Disposition: A | Discharge status: Home / Self Care | Payer: Medicaid Other | Source: Ambulatory Visit | Attending: Obstetrics and Gynecology | Admitting: Obstetrics and Gynecology

## 2017-06-08 ENCOUNTER — Other Ambulatory Visit: Payer: Self-pay

## 2017-06-08 ENCOUNTER — Encounter (HOSPITAL_COMMUNITY): Payer: Self-pay | Admitting: Emergency Medicine

## 2017-06-08 DIAGNOSIS — F1910 Other psychoactive substance abuse, uncomplicated: Secondary | ICD-10-CM | POA: Diagnosis not present

## 2017-06-08 DIAGNOSIS — Z3A36 36 weeks gestation of pregnancy: Secondary | ICD-10-CM

## 2017-06-08 DIAGNOSIS — F1510 Other stimulant abuse, uncomplicated: Secondary | ICD-10-CM | POA: Diagnosis not present

## 2017-06-08 DIAGNOSIS — F17210 Nicotine dependence, cigarettes, uncomplicated: Secondary | ICD-10-CM | POA: Diagnosis present

## 2017-06-08 DIAGNOSIS — O99334 Smoking (tobacco) complicating childbirth: Secondary | ICD-10-CM | POA: Diagnosis present

## 2017-06-08 DIAGNOSIS — F1410 Cocaine abuse, uncomplicated: Secondary | ICD-10-CM | POA: Diagnosis present

## 2017-06-08 DIAGNOSIS — O99324 Drug use complicating childbirth: Secondary | ICD-10-CM | POA: Diagnosis present

## 2017-06-08 DIAGNOSIS — F1210 Cannabis abuse, uncomplicated: Secondary | ICD-10-CM | POA: Diagnosis present

## 2017-06-08 LAB — RAPID URINE DRUG SCREEN, HOSP PERFORMED
AMPHETAMINES: NOT DETECTED
BARBITURATES: NOT DETECTED
BENZODIAZEPINES: NOT DETECTED
COCAINE: NOT DETECTED
OPIATES: NOT DETECTED
TETRAHYDROCANNABINOL: POSITIVE — AB

## 2017-06-08 LAB — CBC
HEMATOCRIT: 33.4 % — AB (ref 36.0–46.0)
Hemoglobin: 11.5 g/dL — ABNORMAL LOW (ref 12.0–15.0)
MCH: 30.3 pg (ref 26.0–34.0)
MCHC: 34.4 g/dL (ref 30.0–36.0)
MCV: 88.1 fL (ref 78.0–100.0)
PLATELETS: 293 10*3/uL (ref 150–400)
RBC: 3.79 MIL/uL — ABNORMAL LOW (ref 3.87–5.11)
RDW: 13.5 % (ref 11.5–15.5)
WBC: 14.2 10*3/uL — ABNORMAL HIGH (ref 4.0–10.5)

## 2017-06-08 LAB — RPR: RPR Ser Ql: NONREACTIVE

## 2017-06-08 LAB — TYPE AND SCREEN
ABO/RH(D): A POS
Antibody Screen: NEGATIVE

## 2017-06-08 LAB — HIV ANTIBODY (ROUTINE TESTING W REFLEX): HIV Screen 4th Generation wRfx: NONREACTIVE

## 2017-06-08 LAB — ABO/RH: ABO/RH(D): A POS

## 2017-06-08 MED ORDER — EPHEDRINE 5 MG/ML INJ
10.0000 mg | INTRAVENOUS | Status: AC | PRN
  Administered 2017-06-08 (×2): 10 mg via INTRAVENOUS

## 2017-06-08 MED ORDER — PHENYLEPHRINE 40 MCG/ML (10ML) SYRINGE FOR IV PUSH (FOR BLOOD PRESSURE SUPPORT)
80.0000 ug | PREFILLED_SYRINGE | INTRAVENOUS | Status: DC | PRN
  Administered 2017-06-08: 80 ug via INTRAVENOUS
  Administered 2017-06-08: 40 ug via INTRAVENOUS
  Filled 2017-06-08: qty 5

## 2017-06-08 MED ORDER — ACETAMINOPHEN 325 MG PO TABS: 650.0000 mg | ORAL_TABLET | ORAL | Status: DC | PRN

## 2017-06-08 MED ORDER — COCONUT OIL OIL: 1.0000 "application " | TOPICAL_OIL | Status: DC | PRN

## 2017-06-08 MED ORDER — SIMETHICONE 80 MG PO CHEW: 80.0000 mg | CHEWABLE_TABLET | ORAL | Status: DC | PRN

## 2017-06-08 MED ORDER — PRENATAL MULTIVITAMIN CH
1.0000 | ORAL_TABLET | Freq: Every day | ORAL | Status: DC
  Administered 2017-06-08: 1 via ORAL
  Filled 2017-06-08: qty 1

## 2017-06-08 MED ORDER — SENNOSIDES-DOCUSATE SODIUM 8.6-50 MG PO TABS
2.0000 | ORAL_TABLET | ORAL | Status: DC
  Administered 2017-06-08: 2 via ORAL
  Filled 2017-06-08: qty 2

## 2017-06-08 MED ORDER — SOD CITRATE-CITRIC ACID 500-334 MG/5ML PO SOLN: 30.0000 mL | ORAL | Status: DC | PRN

## 2017-06-08 MED ORDER — PHENYLEPHRINE 40 MCG/ML (10ML) SYRINGE FOR IV PUSH (FOR BLOOD PRESSURE SUPPORT)
80.0000 ug | PREFILLED_SYRINGE | INTRAVENOUS | Status: DC | PRN
  Filled 2017-06-08: qty 10
  Filled 2017-06-08: qty 5

## 2017-06-08 MED ORDER — OXYTOCIN 40 UNITS IN LACTATED RINGERS INFUSION - SIMPLE MED
INTRAVENOUS | Status: AC
  Filled 2017-06-08: qty 1000

## 2017-06-08 MED ORDER — DIPHENHYDRAMINE HCL 25 MG PO CAPS: 25.0000 mg | ORAL_CAPSULE | Freq: Four times a day (QID) | ORAL | Status: DC | PRN

## 2017-06-08 MED ORDER — FENTANYL 2.5 MCG/ML BUPIVACAINE 1/10 % EPIDURAL INFUSION (WH - ANES)
14.0000 mL/h | INTRAMUSCULAR | Status: DC | PRN
  Administered 2017-06-08: 14 mL/h via EPIDURAL
  Filled 2017-06-08: qty 100

## 2017-06-08 MED ORDER — SODIUM CHLORIDE 0.9 % IV SOLN
2.0000 g | Freq: Four times a day (QID) | INTRAVENOUS | Status: DC
  Administered 2017-06-08: 2 g via INTRAVENOUS
  Filled 2017-06-08 (×4): qty 2000

## 2017-06-08 MED ORDER — LIDOCAINE HCL (PF) 1 % IJ SOLN
30.0000 mL | INTRAMUSCULAR | Status: DC | PRN
  Filled 2017-06-08: qty 30

## 2017-06-08 MED ORDER — OXYTOCIN 40 UNITS IN LACTATED RINGERS INFUSION - SIMPLE MED: 2.5000 [IU]/h | INTRAVENOUS | Status: DC

## 2017-06-08 MED ORDER — ZOLPIDEM TARTRATE 5 MG PO TABS
5.0000 mg | ORAL_TABLET | Freq: Every evening | ORAL | Status: DC | PRN
Start: 2017-06-08 — End: 2017-06-09

## 2017-06-08 MED ORDER — LACTATED RINGERS IV SOLN
500.0000 mL | Freq: Once | INTRAVENOUS | Status: AC
  Administered 2017-06-08: 500 mL via INTRAVENOUS

## 2017-06-08 MED ORDER — EPHEDRINE 5 MG/ML INJ
10.0000 mg | INTRAVENOUS | Status: DC | PRN
  Filled 2017-06-08: qty 2
  Filled 2017-06-08: qty 4

## 2017-06-08 MED ORDER — LIDOCAINE HCL (PF) 1 % IJ SOLN
INTRAMUSCULAR | Status: AC
  Filled 2017-06-08: qty 30

## 2017-06-08 MED ORDER — DIBUCAINE 1 % RE OINT: 1.0000 "application " | TOPICAL_OINTMENT | RECTAL | Status: DC | PRN

## 2017-06-08 MED ORDER — ONDANSETRON HCL 4 MG PO TABS: 4.0000 mg | ORAL_TABLET | ORAL | Status: DC | PRN

## 2017-06-08 MED ORDER — ONDANSETRON HCL 4 MG/2ML IJ SOLN
4.0000 mg | Freq: Four times a day (QID) | INTRAMUSCULAR | Status: DC | PRN
  Administered 2017-06-08: 4 mg via INTRAVENOUS
  Filled 2017-06-08: qty 2

## 2017-06-08 MED ORDER — WITCH HAZEL-GLYCERIN EX PADS: 1.0000 "application " | MEDICATED_PAD | CUTANEOUS | Status: DC | PRN

## 2017-06-08 MED ORDER — ACETAMINOPHEN 325 MG PO TABS
650.0000 mg | ORAL_TABLET | ORAL | Status: DC | PRN
  Administered 2017-06-08: 650 mg via ORAL
  Filled 2017-06-08: qty 2

## 2017-06-08 MED ORDER — TETANUS-DIPHTH-ACELL PERTUSSIS 5-2.5-18.5 LF-MCG/0.5 IM SUSP: 0.5000 mL | Freq: Once | INTRAMUSCULAR | Status: DC

## 2017-06-08 MED ORDER — DIPHENHYDRAMINE HCL 50 MG/ML IJ SOLN: 12.5000 mg | INTRAMUSCULAR | Status: DC | PRN

## 2017-06-08 MED ORDER — OXYTOCIN BOLUS FROM INFUSION
500.0000 mL | Freq: Once | INTRAVENOUS | Status: AC
  Administered 2017-06-08: 500 mL via INTRAVENOUS

## 2017-06-08 MED ORDER — BENZOCAINE-MENTHOL 20-0.5 % EX AERO: 1.0000 "application " | INHALATION_SPRAY | CUTANEOUS | Status: DC | PRN

## 2017-06-08 MED ORDER — LACTATED RINGERS IV SOLN: INTRAVENOUS | Status: DC

## 2017-06-08 MED ORDER — LACTATED RINGERS IV SOLN
500.0000 mL | INTRAVENOUS | Status: DC | PRN
  Administered 2017-06-08: 500 mL via INTRAVENOUS

## 2017-06-08 MED ORDER — IBUPROFEN 600 MG PO TABS
600.0000 mg | ORAL_TABLET | Freq: Four times a day (QID) | ORAL | Status: DC
  Administered 2017-06-08 – 2017-06-09 (×4): 600 mg via ORAL
  Filled 2017-06-08 (×4): qty 1

## 2017-06-08 MED ORDER — BETAMETHASONE SOD PHOS & ACET 6 (3-3) MG/ML IJ SUSP
12.0000 mg | INTRAMUSCULAR | Status: DC
  Administered 2017-06-08: 12 mg via INTRAMUSCULAR
  Filled 2017-06-08 (×2): qty 2

## 2017-06-08 MED ORDER — LIDOCAINE HCL (PF) 1 % IJ SOLN
INTRAMUSCULAR | Status: DC | PRN
  Administered 2017-06-08 (×2): 7 mL via EPIDURAL

## 2017-06-08 MED ORDER — ONDANSETRON HCL 4 MG/2ML IJ SOLN: 4.0000 mg | INTRAMUSCULAR | Status: DC | PRN

## 2017-06-08 NOTE — Anesthesia Procedure Notes (Signed)
Epidural Patient location during procedure: OB Start time: 06/08/2017 7:35 AM End time: 06/08/2017 7:39 AM  Staffing Anesthesiologist: Leilani AbleHatchett, Tarance Balan, MD Performed: anesthesiologist   Preanesthetic Checklist Completed: patient identified, surgical consent, pre-op evaluation, timeout performed, IV checked, risks and benefits discussed and monitors and equipment checked  Epidural Patient position: sitting Prep: site prepped and draped and DuraPrep Patient monitoring: continuous pulse ox and blood pressure Approach: midline Location: L3-L4 Injection technique: LOR air  Needle:  Needle type: Tuohy  Needle gauge: 17 G Needle length: 9 cm and 9 Needle insertion depth: 4 cm Catheter type: closed end flexible Catheter size: 19 Gauge Catheter at skin depth: 9 cm Test dose: negative and Other  Assessment Sensory level: T9 Events: blood not aspirated, injection not painful, no injection resistance, negative IV test and no paresthesia  Additional Notes Reason for block:procedure for pain

## 2017-06-08 NOTE — Anesthesia Pain Management Evaluation Note (Signed)
  CRNA Pain Management Visit Note  Patient: Marisa Kirby, 37 y.o., female  "Hello I am a member of the anesthesia team at [facility]. We have an anesthesia team available at all times to provide care throughout the hospital, including epidural management and anesthesia for C-section. I don't know your plan for the delivery whether it a natural birth, water birth, IV sedation, nitrous supplementation, doula or epidural, but we want to meet your pain goals."   1.Was your pain managed to your expectations on prior hospitalizations?   Yes   2.What is your expectation for pain management during this hospitalization?     Epidural  3.How can we help you reach that goal? epidural  Record the patient's initial score and the patient's pain goal.   Pain: 5  Pain Goal: 4 [facility] wants you to be able to say your pain was always managed very well.  [name] 06/08/2017

## 2017-06-08 NOTE — Anesthesia Postprocedure Evaluation (Signed)
Anesthesia Post Note  Patient: Marisa Kirby  Procedure(s) Performed: AN AD HOC LABOR EPIDURAL     Patient location during evaluation: Mother Baby Anesthesia Type: Epidural Level of consciousness: awake and alert and oriented Pain management: satisfactory to patient Vital Signs Assessment: post-procedure vital signs reviewed and stable Respiratory status: respiratory function stable Cardiovascular status: stable Postop Assessment: no headache, epidural receding, patient able to bend at knees, no signs of nausea or vomiting and adequate PO intake Anesthetic complications: no Comments: The patient is complaining of focal left lower back pain post delivery L-5,S-1 area, left hip. She is applying heat toe the area.     Last Vitals:  Vitals:   06/08/17 1315 06/08/17 1408  BP: (!) 95/54 (!) 101/57  Pulse: 74 83  Resp: 16 16  Temp: 36.8 C 36.9 C  SpO2: 100% 100%    Last Pain:  Vitals:   06/08/17 1724  TempSrc:   PainSc: 5    Pain Goal: Patients Stated Pain Goal: 3 (06/08/17 1415)               [name]

## 2017-06-08 NOTE — Anesthesia Preprocedure Evaluation (Signed)
Anesthesia Evaluation  Patient identified by MRN, date of birth, ID band Patient awake    Reviewed: Allergy & Precautions, H&P , NPO status , Patient's Chart, lab work & pertinent test results  Airway Mallampati: II  TM Distance: >3 FB Neck ROM: full    Dental no notable dental hx. (+) Teeth Intact   Pulmonary Current Smoker,    Pulmonary exam normal breath sounds clear to auscultation       Cardiovascular negative cardio ROS Normal cardiovascular exam Rhythm:regular Rate:Normal     Neuro/Psych negative neurological ROS  negative psych ROS   GI/Hepatic negative GI ROS, Neg liver ROS,   Endo/Other  negative endocrine ROS  Renal/GU      Musculoskeletal   Abdominal Normal abdominal exam  (+)   Peds  Hematology negative hematology ROS (+)   Anesthesia Other Findings   Reproductive/Obstetrics (+) Pregnancy                             Anesthesia Physical Anesthesia Plan  ASA: II  Anesthesia Plan: Epidural   Post-op Pain Management:    Induction:   PONV Risk Score and Plan:   Airway Management Planned:   Additional Equipment:   Intra-op Plan:   Post-operative Plan:   Informed Consent: I have reviewed the patients History and Physical, chart, labs and discussed the procedure including the risks, benefits and alternatives for the proposed anesthesia with the patient or authorized representative who has indicated his/her understanding and acceptance.     Plan Discussed with:   Anesthesia Plan Comments:         Anesthesia Quick Evaluation

## 2017-06-08 NOTE — H&P (Signed)
OBSTETRIC ADMISSION HISTORY AND PHYSICAL  Marisa Kirby is a 37 y.o. female [phone] with IUP at [redacted]w[redacted]d by LMP presenting for premature labor. Onset of contractions at 0330. She reports +FMs, No LOF, no VB, no blurry vision, headaches or peripheral edema, and RUQ pain.  She plans on bottle feeding. She request BTL for birth control. H/o drug abuse, narcotic addiction, UDS+ amphetamines, benzo, THC, cocaine (10/31). She plans on putting the baby up for adoption. She received her prenatal care at [facility].   Her obstetrical history is significant for two. Term SVDs, 3 1st trimestrer SABs and 3 early TABs. Dating: By LMP --->  Estimated Date of Delivery: 07/03/17  Sono:  @[redacted]w[redacted]d , CWD, normal anatomy, 826g.   Prenatal History/Complications:  Past Medical History: Past Medical History:  Diagnosis Date  . Kidney stones   . Pregnancy with adoption planned 03/21/2017    [facility] Initiated Care at  25wk FOB Rubbie Battiest Dating By 2nd trimester U/S 25wk Pap 2018 neg GC/CT Initial:  -/-              36+wks: Genetic Screen Too late CF screen declined Anatomic [place] Normal female Flu vaccine 03/21/17  Tdap Recommended ~ 28wks Glucose Screen  2 hr87/108/83 GBS  Feed Preference bottle Contraception BTL Circumcision Yes at [facility] Childbirth Classes declin  . Substance abuse [facility])     Past Surgical History: Past Surgical History:  Procedure Laterality Date  . LITHOTRIPSY    . STENT PLACE LEFT URETER ([facility] HX)    . STENT REMOVAL    . TONSILLECTOMY      Obstetrical History: OB History    Gravida Para Term Preterm AB Living   10 2 2   6 2    SAB TAB Ectopic Multiple Live Births   3 3     2       Social History: Social History   Socioeconomic History  . Marital status: Single    Spouse name: None  . Number of children: None  . Years of education: None  . Highest education level: None  Social Needs  . Financial resource strain: None  . Food insecurity - worry: None  . Food  insecurity - inability: None  . Transportation needs - medical: None  . Transportation needs - non-medical: None  Occupational History  . None  Tobacco Use  . Smoking status: Current Every Day Smoker    Packs/day: 0.25    Types: Cigarettes  . Smokeless tobacco: Never Used  . Tobacco comment: 5 cigs per day  Substance and Sexual Activity  . Alcohol use: No    Comment: quit drinking in 2016  . Drug use: Yes    Types: Marijuana    Comment: pain pills- took morphine by mouth and snorted morphine  . Sexual activity: Yes    Birth control/protection: None  Other Topics Concern  . None  Social History Narrative  . None    Family History: Family History  Adopted: Yes  Problem Relation Age of Onset  . Kidney disease Mother   . Heart disease Mother   . Obesity Mother   . Depression Mother   . Congestive Heart Failure Maternal Grandmother     Allergies: Allergies  Allergen Reactions  . Tramadol Hives    Medications Prior to Admission  Medication Sig Dispense Refill Last Dose  . acetaminophen (TYLENOL) 500 MG tablet Take 1,000 mg by mouth every 6 (six) hours as needed for mild pain or headache.  Not Taking  . fluconazole (DIFLUCAN) 150 MG tablet Take on tablet prior to antibiotic treatment and another dose after treatment (Patient not taking: Reported on 05/21/2017) 2 tablet 0 Not Taking  . ketoconazole (NIZORAL) 2 % cream Apply 1 application topically 2 (two) times daily. Apply to rash 30 g 0 Taking  . Melatonin 1 MG TABS Take by mouth.   Not Taking  . Prenatal Vit-Fe Fumarate-FA (PRENATAL MULTIVITAMIN) TABS tablet Take 1 tablet by mouth daily at 12 noon.   Taking  . promethazine (PHENERGAN) 25 MG tablet Take 1 tablet (25 mg total) by mouth every 6 (six) hours as needed for nausea or vomiting. (Patient not taking: Reported on 01/25/2016) 30 tablet 0 Not Taking     Review of Systems   All systems reviewed and negative except as stated in HPI  Blood pressure 124/81, pulse  73, temperature 98 F (36.7 C), temperature source Oral, resp. rate (!) 24, last menstrual period 09/03/2016, SpO2 97 %, unknown if currently breastfeeding. General appearance: alert, cooperative, appears stated age and moderate distress Lungs: clear to auscultation bilaterally Heart: regular rate and rhythm Abdomen: soft, non-tender; bowel sounds normal Extremities: Homans sign is negative, no sign of DVT Presentation: cephalic Fetal monitoringBaseline: 135 bpm, Variability: Good {> 6 bpm), Accelerations: Reactive and Decelerations: Variable: single Uterine activityFrequency: Every ~8 minutes Dilation: 5 Effacement (%): 90 Exam by:: h. hardy, rn [phone]    Prenatal labs: ABO, Rh: --/--/A POS, A POS (09/17 1304) Antibody: Negative (11/19 0919) Rubella:   RPR: Non Reactive (11/19 0919)  HBsAg: Negative (10/31 1605)  HIV: Non Reactive (11/19 0919)  GBS:   unknown 2 hr Glucola WNL Genetic screening  Too late Anatomy US normal female  Prenatal Transfer Tool  Maternal Diabetes: No Genetic Screening: Declined Maternal Ultrasounds/Referrals: Normal Fetal Ultrasounds or other Referrals:  Referred to Materal Fetal Medicine  Maternal Substance Abuse:  Yes:  Type: Other:  amphetamines, benzo, THC, cocaine (10/31) Significant Maternal Medications:  None Significant Maternal Lab Results: Lab values include: Other: GBS unknown  No results found for this or any previous visit (from the past 24 hour(s)).  Patient Active Problem List   Diagnosis Date Noted  . Uterine contractions during pregnancy 06/08/2017  . Polysubstance abuse (HCC) 03/22/2017  . Supervision of normal pregnancy 03/21/2017  . Narcotic abuse (HCC) 03/21/2017  . Late prenatal care in second trimester 03/21/2017  . Left ureteral stone 09/16/2015    Assessment/Plan:  Marisa Kirby is a 37 y.o. [id number] at 574w3d here for premature labor.  # Premature Labor: IV betamethasone. Expectant management. - Baby to be put up for  adoption. #Pain: Epidural requested #FWB:  Reactive NST #ID: GBS unknown, IV ampicillin #MOF: bottle #MOC:planned BTL #Circ:  unknown  Alroy BailiffParker W Leland, MD  06/08/2017, 6:56 AM   OB FELLOW HISTORY AND PHYSICAL ATTESTATION  I have seen and examined this patient; I agree with above documentation in the resident's note.   PTL: BMZ given. Expectant management. GBS prophylaxis started  Frederik PearJulie P Degele, MD OB Fellow 06/08/2017, 9:21 AM

## 2017-06-08 NOTE — Progress Notes (Signed)
Labor Progress Note  Marisa Kirby is a 37 y.o. [id number] at 6472w3d  admitted for Preterm labor  S: Patient comfortable with epidural and wanting to rest.   O:  BP (!) 83/60 Comment: pt moving  Pulse (!) 104   Temp 98.7 F (37.1 C) (Oral)   Resp 16   Ht 5\' 3"  (1.6 m)   Wt 78.5 kg (173 lb)   LMP 09/03/2016 (LMP Unknown) Comment: pt had miscarriage in march with DNC  SpO2 97%   BMI 30.65 kg/m   No intake/output data recorded.  FHT:  FHR: 130 bpm, variability: moderate,  accelerations:  Abscent,  decelerations:  Absent UC:   regular, every 2-3 minutes SVE:   Dilation: 8 Effacement (%): 100 Station: 0 Exam by:: Foley,rn Membranes intact  Labs: Lab Results  Component Value Date   WBC 14.2 (H) 06/08/2017   HGB 11.5 (L) 06/08/2017   HCT 33.4 (L) 06/08/2017   MCV 88.1 06/08/2017   PLT 293 06/08/2017    Assessment / Plan: 37 y.o. [id number] 972w3d in active labor Spontaneous labor, progressing normally  Labor: Progressing normally, AROM after antibiotics adequate Fetal Wellbeing:  Category I Pain Control:  Epidural Anticipated MOD:  NSVD. BUFA Contraception: Plan for BTL after delivery  Expectant management   Caryl AdaJazma Phelps, DO OB Fellow

## 2017-06-09 LAB — PMP SCREEN PROFILE (10S), URINE
AMPHETAMINE SCREEN URINE: POSITIVE ng/mL — AB
BARBITURATE SCREEN URINE: NEGATIVE ng/mL
BENZODIAZEPINE SCREEN, URINE: POSITIVE ng/mL — AB
CANNABINOIDS UR QL SCN: POSITIVE ng/mL — AB
COCAINE(METAB.)SCREEN, URINE: NEGATIVE ng/mL
Creatinine(Crt), U: 270.7 mg/dL (ref 20.0–300.0)
METHADONE SCREEN, URINE: NEGATIVE ng/mL
OXYCODONE+OXYMORPHONE UR QL SCN: NEGATIVE ng/mL
Opiate Scrn, Ur: NEGATIVE ng/mL
PHENCYCLIDINE QUANTITATIVE URINE: NEGATIVE ng/mL
PROPOXYPHENE SCREEN URINE: NEGATIVE ng/mL
Ph of Urine: 5.6 (ref 4.5–8.9)

## 2017-06-09 LAB — RUBELLA SCREEN: RUBELLA: 1.29 {index} (ref >0.99)

## 2017-06-09 MED ORDER — IBUPROFEN 600 MG PO TABS: 600.0000 mg | ORAL_TABLET | Freq: Four times a day (QID) | ORAL | 0 refills | Status: DC

## 2017-06-09 MED ORDER — FAMOTIDINE 20 MG PO TABS: 20.0000 mg | ORAL_TABLET | Freq: Two times a day (BID) | ORAL | 2 refills | Status: DC

## 2017-06-09 MED ORDER — FAMOTIDINE 20 MG PO TABS
20.0000 mg | ORAL_TABLET | Freq: Two times a day (BID) | ORAL | Status: DC
  Administered 2017-06-09: 20 mg via ORAL
  Filled 2017-06-09: qty 1

## 2017-06-09 NOTE — Clinical Social Work Note (Signed)
LCSW consulted for MOB's plan to complete adoption on her newborn boy.  LCSW met with MOB who reported that she had met the family she wants her baby to live with and will be signing the paperwork today with her lawyer.  LCSW met with MOB and provided patient request for assess forms for both her and her newborn boy.  LCSW provided emotional support and active listening with regards to MOB who was tearful as she discussed not being able to care for her newborn due to taking care of her elderly parents.  MOB reported that she had smoked marijuana because she was sick throughout her pregnancy and did not use any other substances.  LCSW discussed other drugs that were present in October and MOB denied using any other drugs.  LCSW was provided with legal documents by MOB's attorney and placed these in newborns chart as well as provided nursery with a copy.  MOB anxious to discharge and tearful as she discussed wanting to say goodbye to her newborn.  Nursery is following up with adoptive parents who will visit with newborn in nursery and be given a room to guest and bond with newborn baby boy [place].

## 2017-06-09 NOTE — Discharge Summary (Signed)
OB Discharge Summary     Patient Name: Marisa Kirby DOB: 12-04-1980 MRN: [id number]  Date of admission: 06/08/2017 Delivering MD: Oralia Manis   Date of discharge: 06/09/2017  Admitting diagnosis: 37 WEESK CTX Intrauterine pregnancy: [redacted]w[redacted]d     Secondary diagnosis:  Active Problems:   Preterm labor in third trimester   SVD (spontaneous vaginal delivery)  Additional problems: polysubstance use disorder; plans to place baby for adoption; was considering BTL but declined     Discharge diagnosis: Preterm Pregnancy Delivered                                                                                                Post partum procedures:n/a  Augmentation: AROM at complete dilation  Complications: None  Hospital course:  Onset of Labor With Vaginal Delivery     37 y.o. yo [id number] at [redacted]w[redacted]d was admitted in Latent Labor on 06/08/2017. She was given a dose of BMZ and Amp x 1 dose. Patient had an uncomplicated labor course as follows:  Membrane Rupture Time/Date: 11:12 AM ,06/08/2017   Intrapartum Procedures: Episiotomy: None [1]                                         Lacerations:  None [1]  Patient had a delivery of a Viable infant. 06/08/2017  Information for the patient's newborn:  Marisa, Kirby [[id number]]  Delivery Method: Vaginal, Spontaneous(Filed from Delivery Summary)    Pateint had an uncomplicated postpartum course.  She is ambulating, tolerating a regular diet, passing flatus, and urinating well. Patient is discharged home in stable condition on 06/09/17 per her request, as adoption will be processed this afternoon. SW consult pending.   Physical exam  Vitals:   06/08/17 1408 06/08/17 1730 06/08/17 2028 06/09/17 0625  BP: (!) 101/57 105/62 (!) 98/51 103/60  Pulse: 83 75 66 (!) 59  Resp: 16 20 20 20   Temp: 98.5 F (36.9 C) 98.8 F (37.1 C) 97.8 F (36.6 C) 98 F (36.7 C)  TempSrc: Oral Oral Axillary Oral  SpO2: 100% 100%  100%  Weight:      Height:        General: alert, cooperative and no distress Lochia: appropriate Uterine Fundus: firm Incision: N/A DVT Evaluation: No evidence of DVT seen on physical exam. No significant calf/ankle edema. Labs: Lab Results  Component Value Date   WBC 14.2 (H) 06/08/2017   HGB 11.5 (L) 06/08/2017   HCT 33.4 (L) 06/08/2017   MCV 88.1 06/08/2017   PLT 293 06/08/2017   CMP Latest Ref Rng & Units 01/25/2016  Glucose 65 - 99 mg/dL 88  BUN 6 - 20 mg/dL 16  Creatinine 3.08 - 6.57 mg/dL 8.46  Sodium 962 - 952 mmol/L 136  Potassium 3.5 - 5.1 mmol/L 3.8  Chloride 101 - 111 mmol/L 111  CO2 22 - 32 mmol/L 24  Calcium 8.9 - 10.3 mg/dL 8.4(X)  Total Protein 6.4 - 8.2 g/dL -  Total Bilirubin 0.2 - 1.0 mg/dL -  Alkaline Phos 50 - 136 Unit/L -  AST 15 - 37 Unit/L -  ALT U/L -    Discharge instruction: per After Visit Summary and "Baby and Me Booklet".  After visit meds:  Allergies as of 06/09/2017      Reactions   Tramadol Hives      Medication List    STOP taking these medications   prenatal multivitamin Tabs tablet     TAKE these medications   acetaminophen 500 MG tablet Commonly known as:  TYLENOL Take 1,000 mg by mouth every 6 (six) hours as needed for mild pain or headache.   famotidine 20 MG tablet Commonly known as:  PEPCID Take 1 tablet (20 mg total) by mouth 2 (two) times daily.   ibuprofen 600 MG tablet Commonly known as:  ADVIL,MOTRIN Take 1 tablet (600 mg total) by mouth every 6 (six) hours.       Diet: routine diet  Activity: Advance as tolerated. Pelvic rest for 6 weeks.   Outpatient follow up:4 weeks Follow up Appt: Future Appointments  Date Time Provider Department Center  06/12/2017  2:00 PM Marisa MarkerBooker, Elif Yonts Kirby, CNM [facility]   Follow up Visit:No Follow-up on file.  Postpartum contraception: Nexplanon  Newborn Data: Live born female  Birth Weight: 6 lb (2722 g) APGAR: 8, 9  Newborn Delivery   Birth date/time:  06/08/2017 11:30:00 Delivery type:   Vaginal, Spontaneous     Baby Feeding: Bottle Disposition:private adoption   06/09/2017 Marisa BailiffParker W Leland, MD  CNM attestation I have seen and examined this patient and agree with above documentation in the resident's note.   Marisa BannisterVickie Kirby Kirby is a 37 y.o. [id number] s/p SVD of preterm infant- to be placed for adoption.  Pain is well controlled.  Plan for birth control is Nexplanon.  Method of Feeding: bottle  PE:  BP 103/60 (BP Location: Left Arm)   Pulse (!) 59   Temp 98 F (36.7 C) (Oral)   Resp 20   Ht 5\' 3"  (1.6 m)   Wt 78.5 kg (173 lb)   LMP 09/03/2016 (LMP Unknown) Comment: pt had miscarriage in march with DNC  SpO2 100%   Breastfeeding? Unknown   BMI 30.65 kg/m  Fundus firm  Recent Labs    06/08/17 0635  HGB 11.5*  HCT 33.4*     Plan: discharge today - postpartum care discussed - f/u clinic in 4 weeks for postpartum visit   Cam Kirby, Marisa Poehlman, CNM 9:17 AM 06/09/2017

## 2017-06-10 LAB — CULTURE, BETA STREP (GROUP B ONLY)

## 2017-06-12 ENCOUNTER — Encounter: Payer: Medicaid Other | Admitting: Women's Health

## 2017-07-20 ENCOUNTER — Ambulatory Visit: Payer: Medicaid Other | Admitting: Women's Health

## 2017-07-31 ENCOUNTER — Ambulatory Visit: Payer: Medicaid Other | Admitting: Advanced Practice Midwife

## 2017-07-31 ENCOUNTER — Encounter: Payer: Self-pay | Admitting: *Deleted

## 2017-08-22 ENCOUNTER — Encounter (HOSPITAL_COMMUNITY): Payer: Self-pay | Admitting: Emergency Medicine

## 2017-08-22 ENCOUNTER — Other Ambulatory Visit: Payer: Self-pay

## 2017-08-22 ENCOUNTER — Emergency Department (HOSPITAL_COMMUNITY)
Admission: EM | Admit: 2017-08-22 | Discharge: 2017-08-22 | Disposition: A | Discharge status: Home / Self Care | Payer: Medicaid Other | Attending: Emergency Medicine | Admitting: Emergency Medicine

## 2017-08-22 ENCOUNTER — Emergency Department (HOSPITAL_COMMUNITY): Payer: Medicaid Other

## 2017-08-22 DIAGNOSIS — Y999 Unspecified external cause status: Secondary | ICD-10-CM | POA: Diagnosis not present

## 2017-08-22 DIAGNOSIS — F17210 Nicotine dependence, cigarettes, uncomplicated: Secondary | ICD-10-CM | POA: Diagnosis not present

## 2017-08-22 DIAGNOSIS — Y929 Unspecified place or not applicable: Secondary | ICD-10-CM | POA: Insufficient documentation

## 2017-08-22 DIAGNOSIS — S92512A Displaced fracture of proximal phalanx of left lesser toe(s), initial encounter for closed fracture: Secondary | ICD-10-CM | POA: Diagnosis not present

## 2017-08-22 DIAGNOSIS — W502XXA Accidental twist by another person, initial encounter: Secondary | ICD-10-CM | POA: Insufficient documentation

## 2017-08-22 DIAGNOSIS — S99922A Unspecified injury of left foot, initial encounter: Secondary | ICD-10-CM | POA: Diagnosis present

## 2017-08-22 DIAGNOSIS — Y939 Activity, unspecified: Secondary | ICD-10-CM | POA: Insufficient documentation

## 2017-08-22 MED ORDER — IBUPROFEN 800 MG PO TABS: 800.0000 mg | ORAL_TABLET | Freq: Three times a day (TID) | ORAL | 0 refills | Status: DC
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
## 2017-08-22 NOTE — Discharge Instructions (Signed)
Take ibuprofen 3 times a day with meals.  Do not take other anti-inflammatories at the same time open (Advil, Motrin, naproxen, Aleve). You may supplement with Tylenol if you need further pain control. Use ice packs to help with pain and swelling. Follow-up with the foot doctor, Dr. Nolen MuMcKinney, for further evaluation and management of your toe. Return to the emergency room if you develop any new or concerning symptoms.
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
## 2017-09-19 ENCOUNTER — Emergency Department (HOSPITAL_COMMUNITY)
Admission: EM | Admit: 2017-09-19 | Discharge: 2017-09-19 | Disposition: A | Discharge status: Home / Self Care | Payer: Medicaid Other | Attending: Emergency Medicine | Admitting: Emergency Medicine

## 2017-09-19 ENCOUNTER — Encounter (HOSPITAL_COMMUNITY): Payer: Self-pay | Admitting: Emergency Medicine

## 2017-09-19 ENCOUNTER — Other Ambulatory Visit: Payer: Self-pay

## 2017-09-19 DIAGNOSIS — Z3202 Encounter for pregnancy test, result negative: Secondary | ICD-10-CM | POA: Insufficient documentation

## 2017-09-19 DIAGNOSIS — F17210 Nicotine dependence, cigarettes, uncomplicated: Secondary | ICD-10-CM | POA: Insufficient documentation

## 2017-09-19 DIAGNOSIS — L0291 Cutaneous abscess, unspecified: Secondary | ICD-10-CM

## 2017-09-19 DIAGNOSIS — L02414 Cutaneous abscess of left upper limb: Secondary | ICD-10-CM | POA: Insufficient documentation

## 2017-09-19 DIAGNOSIS — Z79899 Other long term (current) drug therapy: Secondary | ICD-10-CM | POA: Insufficient documentation

## 2017-09-19 LAB — POC URINE PREG, ED: PREG TEST UR: NEGATIVE

## 2017-09-19 MED ORDER — SULFAMETHOXAZOLE-TRIMETHOPRIM 800-160 MG PO TABS
1.0000 | ORAL_TABLET | Freq: Once | ORAL | Status: AC
  Administered 2017-09-19: 1 via ORAL
  Filled 2017-09-19: qty 1

## 2017-09-19 MED ORDER — SULFAMETHOXAZOLE-TRIMETHOPRIM 800-160 MG PO TABS: 1.0000 | ORAL_TABLET | Freq: Two times a day (BID) | ORAL | 0 refills | Status: AC

## 2017-09-19 MED ORDER — IBUPROFEN 600 MG PO TABS: 600.0000 mg | ORAL_TABLET | Freq: Four times a day (QID) | ORAL | 0 refills | Status: DC | PRN

## 2017-09-19 NOTE — ED Triage Notes (Signed)
Pt c/o abscess to left medial arm. Redness noted with quarter size red raises abscess with open head.

## 2017-09-19 NOTE — Discharge Instructions (Addendum)
Complete the entire course of the antibiotics prescribed.  Use warm sudsy water twice daily, dry then apply a fresh bandage.

## 2017-09-20 NOTE — ED Provider Notes (Signed)
[facility] EMERGENCY DEPARTMENT Provider Note   CSN: [id number] Arrival date & time: 09/19/17  1725     History   Chief Complaint Chief Complaint  Patient presents with  . Abscess    HPI Marisa Kirby is a 37 y.o. female.  The history is provided by the patient.  Abscess  Location:  Shoulder/arm Shoulder/arm abscess location:  L forearm Abscess quality: draining, painful, redness and warmth   Red streaking: no   Duration:  5 days Progression:  Partially resolved (the site started draining yesteday, spontaneously) Pain details:    Quality:  Sharp   Severity:  Moderate   Timing:  Constant   Progression:  Partially resolved Chronicity:  New Context: not diabetes, not immunosuppression, not injected drug use and not skin injury   Context comment:  Pt is unsure of the cause of this infection. Denies IV drug use. Relieved by:  Warm compresses Associated symptoms: no anorexia, no fatigue, no fever, no nausea and no vomiting     Past Medical History:  Diagnosis Date  . Kidney stones   . Pregnancy with adoption planned 03/21/2017    [facility] Initiated Care at  25wk FOB [facility]iest Dating By 2nd trimester U/S 25wk Pap 2018 neg GC/CT Initial:  -/-              36+wks: Genetic Screen Too late CF screen declined Anatomic [place] Normal female Flu vaccine 03/21/17  Tdap Recommended ~ 28wks Glucose Screen  2 hr87/108/83 GBS  Feed Preference bottle Contraception BTL Circumcision Yes at [facility] declin  . Substance abuse [facility])     Patient Active Problem List   Diagnosis Date Noted  . Preterm labor in third trimester 06/08/2017  . SVD (spontaneous vaginal delivery) 06/08/2017  . Polysubstance abuse (HCC) 03/22/2017  . Supervision of normal pregnancy 03/21/2017  . Narcotic abuse (HCC) 03/21/2017  . Late prenatal care in second trimester 03/21/2017  . Left ureteral stone 09/16/2015    Past Surgical History:  Procedure Laterality Date  .  LITHOTRIPSY    . STENT PLACE LEFT URETER ([facility] HX)    . STENT REMOVAL    . TONSILLECTOMY       OB History    Gravida  10   Para  3   Term  2   Preterm  1   AB  6   Living  3     SAB  3   TAB  3   Ectopic      Multiple  0   Live Births  3            Home Medications    Prior to Admission medications   Medication Sig Start Date End Date Taking? Authorizing Provider  acetaminophen (TYLENOL) 500 MG tablet Take 1,000 mg by mouth every 6 (six) hours as needed for mild pain or headache.    [provider]  famotidine (PEPCID) 20 MG tablet Take 1 tablet (20 mg total) by mouth 2 (two) times daily. 06/09/17   Alroy Bailiff, MD  ibuprofen (ADVIL,MOTRIN) 600 MG tablet Take 1 tablet (600 mg total) by mouth every 6 (six) hours as needed. 09/19/17   Burgess Amor, PA-C  sulfamethoxazole-trimethoprim (BACTRIM DS,SEPTRA DS) 800-160 MG tablet Take 1 tablet by mouth 2 (two) times daily for 10 days. 09/19/17 09/29/17  Burgess Amor, PA-C    Family History Family History  Adopted: Yes  Problem Relation Age of Onset  . Kidney disease Mother   .  Heart disease Mother   . Obesity Mother   . Depression Mother   . Congestive Heart Failure Maternal Grandmother     Social History Social History   Tobacco Use  . Smoking status: Current Every Day Smoker    Packs/day: 0.25    Types: Cigarettes  . Smokeless tobacco: Never Used  . Tobacco comment: 5 cigs per day  Substance Use Topics  . Alcohol use: No    Comment: quit drinking in 2016  . Drug use: Yes    Types: Marijuana    Comment: last used today     Allergies   Tramadol   Review of Systems Review of Systems  Constitutional: Negative for chills, fatigue and fever.  Respiratory: Negative for shortness of breath and wheezing.   Gastrointestinal: Negative for anorexia, nausea and vomiting.  Skin: Positive for color change and wound. Negative for rash.  Neurological: Negative for numbness.     Physical  Exam Updated Vital Signs BP (!) 133/111 (BP Location: Right Arm)   Pulse 68   Temp 97.9 F (36.6 C) (Oral)   Resp 16   Wt 68 kg (150 lb)   LMP 08/15/2017   SpO2 100%   BMI 27.44 kg/m   Physical Exam  Constitutional: She appears well-developed and well-nourished. No distress.  HENT:  Head: Normocephalic.  Neck: Neck supple.  Cardiovascular: Normal rate.  Pulmonary/Chest: Effort normal. No respiratory distress.  Musculoskeletal: Normal range of motion. She exhibits no edema.  Skin: There is erythema.  Pt with quarter sized raised induration left lateral mid forearm with widely patent center where purulence had drained.  There is a 3 cm surrounding area of erythema without red streaking.       ED Treatments / Results  Labs (all labs ordered are listed, but only abnormal results are displayed) Labs Reviewed  POC URINE PREG, ED    EKG None  Radiology No results found.  Procedures Procedures (including critical care time)  Medications Ordered in ED Medications  sulfamethoxazole-trimethoprim (BACTRIM DS,SEPTRA DS) 800-160 MG per tablet 1 tablet (1 tablet Oral Given 09/19/17 1916)     Initial Impression / Assessment and Plan / ED Course  I have reviewed the triage vital signs and the nursing notes.  Pertinent labs & imaging results that were available during my care of the patient were reviewed by me and considered in my medical decision making (see chart for details).     Pt with resolving abscess with spontaneous drainage and with mild surrounding erythema.  Placed on bactrim, ibuprofen recommended prn. Continued warm water soaks. Prn f/u with pcp for any persistent or worsened sx.  Final Clinical Impressions(s) / ED Diagnoses   Final diagnoses:  Abscess    ED Discharge Orders        Ordered    sulfamethoxazole-trimethoprim (BACTRIM DS,SEPTRA DS) 800-160 MG tablet  2 times daily     09/19/17 1908    ibuprofen (ADVIL,MOTRIN) 600 MG tablet  Every 6 hours PRN      09/19/17 1908       Burgess Amor, Cordelia Poche 09/20/17 1606    Raeford Razor, MD 09/20/17 2249

## 2018-02-10 ENCOUNTER — Emergency Department (HOSPITAL_COMMUNITY)
Admission: EM | Admit: 2018-02-10 | Discharge: 2018-02-10 | Disposition: A | Discharge status: Home / Self Care | Payer: Medicaid Other | Attending: Emergency Medicine | Admitting: Emergency Medicine

## 2018-02-10 ENCOUNTER — Emergency Department (HOSPITAL_COMMUNITY): Payer: Medicaid Other

## 2018-02-10 ENCOUNTER — Encounter: Payer: Self-pay | Admitting: Emergency Medicine

## 2018-02-10 DIAGNOSIS — O99332 Smoking (tobacco) complicating pregnancy, second trimester: Secondary | ICD-10-CM | POA: Insufficient documentation

## 2018-02-10 DIAGNOSIS — Z3A24 24 weeks gestation of pregnancy: Secondary | ICD-10-CM | POA: Diagnosis not present

## 2018-02-10 DIAGNOSIS — O1202 Gestational edema, second trimester: Secondary | ICD-10-CM | POA: Diagnosis present

## 2018-02-10 DIAGNOSIS — O99322 Drug use complicating pregnancy, second trimester: Secondary | ICD-10-CM | POA: Diagnosis not present

## 2018-02-10 DIAGNOSIS — Z79899 Other long term (current) drug therapy: Secondary | ICD-10-CM | POA: Diagnosis not present

## 2018-02-10 DIAGNOSIS — F1210 Cannabis abuse, uncomplicated: Secondary | ICD-10-CM | POA: Insufficient documentation

## 2018-02-10 DIAGNOSIS — F17210 Nicotine dependence, cigarettes, uncomplicated: Secondary | ICD-10-CM | POA: Diagnosis not present

## 2018-02-10 DIAGNOSIS — O99712 Diseases of the skin and subcutaneous tissue complicating pregnancy, second trimester: Secondary | ICD-10-CM | POA: Insufficient documentation

## 2018-02-10 DIAGNOSIS — T782XXA Anaphylactic shock, unspecified, initial encounter: Secondary | ICD-10-CM | POA: Insufficient documentation

## 2018-02-10 LAB — CBC WITH DIFFERENTIAL/PLATELET
Band Neutrophils: 1 %
Basophils Absolute: 0 10*3/uL (ref 0.0–0.1)
Basophils Relative: 0 %
Blasts: 0 %
Eosinophils Absolute: 0.2 10*3/uL (ref 0.0–0.7)
Eosinophils Relative: 1 %
HCT: 43.6 % (ref 36.0–46.0)
Hemoglobin: 14.4 g/dL (ref 12.0–15.0)
Lymphocytes Relative: 30 %
Lymphs Abs: 6.8 10*3/uL — ABNORMAL HIGH (ref 0.7–4.0)
MCH: 31.2 pg (ref 26.0–34.0)
MCHC: 33 g/dL (ref 30.0–36.0)
MCV: 94.4 fL (ref 78.0–100.0)
Metamyelocytes Relative: 0 %
Monocytes Absolute: 0.2 10*3/uL (ref 0.1–1.0)
Monocytes Relative: 1 %
Myelocytes: 0 %
Neutro Abs: 15.3 10*3/uL — ABNORMAL HIGH (ref 1.7–7.7)
Neutrophils Relative %: 67 %
Other: 0 %
Platelets: 375 10*3/uL (ref 150–400)
Promyelocytes Relative: 0 %
RBC: 4.62 MIL/uL (ref 3.87–5.11)
RDW: 13.2 % (ref 11.5–15.5)
WBC: 22.5 10*3/uL — ABNORMAL HIGH (ref 4.0–10.5)
nRBC: 0 /100 WBC

## 2018-02-10 LAB — URINALYSIS, ROUTINE W REFLEX MICROSCOPIC
Bilirubin Urine: NEGATIVE
Glucose, UA: NEGATIVE mg/dL
Hgb urine dipstick: NEGATIVE
Ketones, ur: 5 mg/dL — AB
Leukocytes, UA: NEGATIVE
Nitrite: NEGATIVE
Protein, ur: NEGATIVE mg/dL
Specific Gravity, Urine: 1.019 (ref 1.005–1.030)
pH: 5 (ref 5.0–8.0)

## 2018-02-10 LAB — BASIC METABOLIC PANEL
Anion gap: 9 (ref 5–15)
BUN: 12 mg/dL (ref 6–20)
CO2: 20 mmol/L — ABNORMAL LOW (ref 22–32)
Calcium: 7.7 mg/dL — ABNORMAL LOW (ref 8.9–10.3)
Chloride: 109 mmol/L (ref 98–111)
Creatinine, Ser: 0.65 mg/dL (ref 0.44–1.00)
GFR calc Af Amer: >60 mL/min (ref >60)
GFR calc non Af Amer: >60 mL/min (ref >60)
Glucose, Bld: 142 mg/dL — ABNORMAL HIGH (ref 70–99)
Potassium: 3.1 mmol/L — ABNORMAL LOW (ref 3.5–5.1)
Sodium: 138 mmol/L (ref 135–145)

## 2018-02-10 LAB — HCG, QUANTITATIVE, PREGNANCY: hCG, Beta Chain, Quant, S: 12767 m[IU]/mL — ABNORMAL HIGH (ref <5)

## 2018-02-10 MED ORDER — POTASSIUM CHLORIDE CRYS ER 20 MEQ PO TBCR
60.0000 meq | EXTENDED_RELEASE_TABLET | Freq: Once | ORAL | Status: AC
  Administered 2018-02-10: 60 meq via ORAL
  Filled 2018-02-10: qty 3

## 2018-02-10 MED ORDER — SODIUM CHLORIDE 0.9 % IV BOLUS
1000.0000 mL | Freq: Once | INTRAVENOUS | Status: AC
  Administered 2018-02-10: 1000 mL via INTRAVENOUS

## 2018-02-10 MED ORDER — PREDNISONE 20 MG PO TABS: 60.0000 mg | ORAL_TABLET | Freq: Every day | ORAL | 0 refills | Status: DC

## 2018-02-10 MED ORDER — FAMOTIDINE IN NACL 20-0.9 MG/50ML-% IV SOLN
20.0000 mg | Freq: Once | INTRAVENOUS | Status: AC
  Administered 2018-02-10: 20 mg via INTRAVENOUS
  Filled 2018-02-10: qty 50

## 2018-02-10 MED ORDER — DIPHENHYDRAMINE HCL 25 MG PO TABS: 25.0000 mg | ORAL_TABLET | Freq: Four times a day (QID) | ORAL | 0 refills | Status: DC

## 2018-02-10 MED ORDER — SODIUM CHLORIDE 0.9 % IV BOLUS: 500.0000 mL | Freq: Once | INTRAVENOUS | Status: DC

## 2018-02-10 MED ORDER — METHYLPREDNISOLONE SODIUM SUCC 125 MG IJ SOLR
125.0000 mg | Freq: Once | INTRAMUSCULAR | Status: AC
  Administered 2018-02-10: 125 mg via INTRAVENOUS
  Filled 2018-02-10: qty 2

## 2018-02-10 MED ORDER — EPINEPHRINE 0.3 MG/0.3ML IJ SOAJ: 0.3000 mg | Freq: Once | INTRAMUSCULAR | 2 refills | Status: AC

## 2018-02-10 MED ORDER — FAMOTIDINE 20 MG PO TABS: 20.0000 mg | ORAL_TABLET | Freq: Two times a day (BID) | ORAL | 0 refills | Status: DC

## 2018-02-10 NOTE — ED Triage Notes (Signed)
Pt here from home with c/o allergic reaction , pt received .3mg  of epi times 2 doses , 700 cc of fluid , 50 mg of benadryl, and albuterol

## 2018-02-10 NOTE — ED Provider Notes (Signed)
Ultrasound ED Pelvic Date/Time: 02/10/2018 7:26 AM Performed by: Linwood DibblesKnapp, Farrie Sann, MD Authorized by: Linwood DibblesKnapp, Sherree Shankman, MD   Procedure details:    Indications: evaluate for IUP     Technique:  Transabdominal obstetric (HCG+) exam   Images: archived    Uterine findings:    Intrauterine pregnancy: identified     Single gestation: identified     Fetal heart rate: identified     Estimated gestational age: 4624 weeks Left ovary findings:    Left ovary:  Not visualized    Right ovary findings:     Right ovary:  Not visualized    Other findings:    Free pelvic fluid: not identified     Free peritoneal fluid: not identified   Comments:     Transabdominal us.  FHR at 136  Performed by PA Law under my supervision.   Pt presented with an allergic reaction, pt also mentioned that she thought she was pregnant but had not seen an OB doctor.  She was not sure of her gestational age.  Pt appears to be breathing easily.  Lips are swollen but no resp distress.  Rapid OB is at the bedside.  US performed to get an estimate of her pregnancy status.  IUP confirmed on US.  Fetal cardiac activity noted.  Age estimated by biparietal diameter.  Will continue to monitor.   Linwood DibblesKnapp, Saajan Willmon, MD 02/10/18 [phone]

## 2018-02-10 NOTE — Progress Notes (Signed)
Pt presents with swollen tongue. Says she woke up this morning sneezing, itching, and her tongue swelling. Says she is not allergic to any foods that she knows of. Says that her only med allergy is tramadol. Denies taking any drugs other than smoking pot yesterday around 5 pm. Pt says she has been pregnant at least seven times with three living children. In the chart, she is listed as a G11P3. Says she is pregnant now, but has not had a prenatal visit yet. [facility] An informal bedside ultrasound done by the ED MD, puts her gestational age at 2721 4/7 weeks. FHR 136 BPM. No vaginal bleeding or leaking of fluid. ED provider will order an OB limited ultrasound.

## 2018-02-10 NOTE — ED Provider Notes (Signed)
[facility] EMERGENCY DEPARTMENT Provider Note   CSN: [id number] Arrival date & time: 02/10/18  4098     History   Chief Complaint Chief Complaint  Patient presents with  . Allergic Reaction    HPI Marisa Kirby is a 37 y.o. female with history of polysubstance abuse, kidney stones, recent vaginal delivery in January 2019, with reported repeat pregnancy currently who presents with allergic reaction.  Patient reports she got up to go to the bathroom and started sneezing for about 5 minutes.  She then reported feeling like her feet were getting hot and swollen and then she began to feel like her throat was closing and her lips were swelling.  She was wheezing and was given albuterol in route.  She was also given two 0.3 epi, 700 mL fluid bolus, and 50 mg Benadryl.  Patient denies any new foods or medications, but does smoke marijuana and used from a new dealer yesterday.  Patient has no known allergies, although did have some type of allergic reaction from a pain medicine at one time.  Patient believes she is pregnant and has not received any prenatal care as of yet.  She reports taking a positive home pregnancy test.  She denies of abnormal bleeding or discharge.  She denies any abdominal pain.  LMP reported 08/15/2017.  HPI  Past Medical History:  Diagnosis Date  . Kidney stones   . Pregnancy with adoption planned 03/21/2017    [facility] at  25wk FOB Rubbie Battiest Dating By 2nd trimester U/S 25wk Pap 2018 neg GC/CT Initial:  -/-              36+wks: Genetic Screen Too late CF screen declined Anatomic [place] Normal female Flu vaccine 03/21/17  Tdap Recommended ~ 28wks Glucose Screen  2 hr87/108/83 GBS  Feed Preference bottle Contraception BTL Circumcision Yes at [facility] Childbirth Classes declin  . Substance abuse [facility])     Patient Active Problem List   Diagnosis Date Noted  . Preterm labor in third trimester 06/08/2017  . SVD (spontaneous  vaginal delivery) 06/08/2017  . Polysubstance abuse (HCC) 03/22/2017  . Supervision of normal pregnancy 03/21/2017  . Narcotic abuse (HCC) 03/21/2017  . Late prenatal care in second trimester 03/21/2017  . Left ureteral stone 09/16/2015    Past Surgical History:  Procedure Laterality Date  . LITHOTRIPSY    . STENT PLACE LEFT URETER ([facility] HX)    . STENT REMOVAL    . TONSILLECTOMY       OB History    Gravida  11   Para  3   Term  2   Preterm  1   AB  6   Living  3     SAB  3   TAB  3   Ectopic      Multiple  0   Live Births  3            Home Medications    Prior to Admission medications   Medication Sig Start Date End Date Taking? Authorizing Provider  Prenatal Multivit-Min-Fe-FA (PRENATAL VITAMINS PO) Take 1 tablet by mouth daily.   Yes [provider]  diphenhydrAMINE (BENADRYL) 25 MG tablet Take 1 tablet (25 mg total) by mouth every 6 (six) hours. 02/10/18   Justis Dupas, Waylan Boga, PA-C  EPINEPHrine 0.3 mg/0.3 mL IJ SOAJ injection Inject 0.3 mLs (0.3 mg total) into the muscle once for 1 dose. 02/10/18 02/10/18  Buel Ream  M, PA-C  famotidine (PEPCID) 20 MG tablet Take 1 tablet (20 mg total) by mouth 2 (two) times daily. Patient not taking: Reported on 02/10/2018 06/09/17   Alroy Bailiff, MD  famotidine (PEPCID) 20 MG tablet Take 1 tablet (20 mg total) by mouth 2 (two) times daily. 02/10/18   Anant Agard, Waylan Boga, PA-C  ibuprofen (ADVIL,MOTRIN) 600 MG tablet Take 1 tablet (600 mg total) by mouth every 6 (six) hours as needed. Patient not taking: Reported on 02/10/2018 09/19/17   Burgess Amor, PA-C  predniSONE (DELTASONE) 20 MG tablet Take 3 tablets (60 mg total) by mouth daily. 02/10/18   Emi Holes, PA-C    Family History Family History  Adopted: Yes  Problem Relation Age of Onset  . Kidney disease Mother   . Heart disease Mother   . Obesity Mother   . Depression Mother   . Congestive Heart Failure Maternal Grandmother     Social  History Social History   Tobacco Use  . Smoking status: Current Every Day Smoker    Packs/day: 0.25    Types: Cigarettes  . Smokeless tobacco: Never Used  . Tobacco comment: 5 cigs per day  Substance Use Topics  . Alcohol use: No    Comment: quit drinking in 2016  . Drug use: Yes    Types: Marijuana    Comment: last used today     Allergies   Tramadol   Review of Systems Review of Systems  Constitutional: Negative for chills and fever.  HENT: Positive for facial swelling. Negative for sore throat.   Respiratory: Positive for shortness of breath.   Cardiovascular: Negative for chest pain.  Gastrointestinal: Negative for abdominal pain, nausea and vomiting.  Genitourinary: Negative for dysuria.  Musculoskeletal: Negative for back pain.  Skin: Negative for rash and wound.  Neurological: Negative for headaches.  Psychiatric/Behavioral: The patient is not nervous/anxious.      Physical Exam Updated Vital Signs BP (!) 88/62   Pulse 84   Temp (!) 97.4 F (36.3 C) (Temporal)   Resp 19   LMP 08/15/2017   SpO2 98%   Physical Exam  Constitutional: She appears well-developed and well-nourished. No distress.  HENT:  Head: Normocephalic and atraumatic.  Mouth/Throat: Oropharynx is clear and moist. No oropharyngeal exudate.  Swelling of the tongue and lips as well as the face Oropharynx patent  Eyes: Pupils are equal, round, and reactive to light. Conjunctivae are normal. Right eye exhibits no discharge. Left eye exhibits no discharge. No scleral icterus.  Neck: Normal range of motion. Neck supple. No thyromegaly present.  Cardiovascular: Normal rate, regular rhythm, normal heart sounds and intact distal pulses. Exam reveals no gallop and no friction rub.  No murmur heard. Pulmonary/Chest: Effort normal and breath sounds normal. No stridor. No respiratory distress. She has no wheezes. She has no rales.  Abdominal: Soft. Bowel sounds are normal. She exhibits no distension.  There is no tenderness. There is no rebound and no guarding.  Musculoskeletal: She exhibits no edema.  Lymphadenopathy:    She has no cervical adenopathy.  Neurological: She is alert. Coordination normal.  Skin: Skin is warm and dry. No rash noted. She is not diaphoretic. No pallor.  Psychiatric: She has a normal mood and affect.  Nursing note and vitals reviewed.    ED Treatments / Results  Labs (all labs ordered are listed, but only abnormal results are displayed) Labs Reviewed  BASIC METABOLIC PANEL - Abnormal; Notable for the following components:  Result Value   Potassium 3.1 (*)    CO2 20 (*)    Glucose, Bld 142 (*)    Calcium 7.7 (*)    All other components within normal limits  CBC WITH DIFFERENTIAL/PLATELET - Abnormal; Notable for the following components:   WBC 22.5 (*)    Neutro Abs 15.3 (*)    Lymphs Abs 6.8 (*)    All other components within normal limits  HCG, QUANTITATIVE, PREGNANCY - Abnormal; Notable for the following components:   hCG, Beta Chain, Quant, S 12,767 (*)    All other components within normal limits  URINALYSIS, ROUTINE W REFLEX MICROSCOPIC - Abnormal; Notable for the following components:   APPearance HAZY (*)    Ketones, ur 5 (*)    All other components within normal limits    EKG None  Radiology [place] Ob Limited  Result Date: 02/10/2018 CLINICAL DATA:  Gestational age assessment. No OB/prenatal care for current pregnancy. EXAM: LIMITED OBSTETRIC ULTRASOUND FINDINGS: Number of Fetuses: 1 Heart Rate:  141 bpm Movement: Yes Presentation: Breech Placental Location: Anterior Previa: No Amniotic Fluid (Subjective):  Within normal limits. BPD: 4.7 cm 20 w  2 d MATERNAL FINDINGS: Cervix:  Appears closed. Uterus/Adnexae: No abnormality visualized. IMPRESSION: 1. Single live anterior pregnancy with a measured gestational age of [redacted] weeks and 2 days. No pregnancy complication. This exam is performed on an emergent basis and does not comprehensively  evaluate fetal size, dating, or anatomy; follow-up complete OB US should be considered if further fetal assessment is warranted. Electronically Signed   By: Amie Portlandavid  Ormond M.D.   On: 02/10/2018 08:52    Procedures Procedures (including critical care time)  Medications Ordered in ED Medications  methylPREDNISolone sodium succinate (SOLU-MEDROL) 125 mg/2 mL injection 125 mg (125 mg Intravenous Given 02/10/18 0721)  famotidine (PEPCID) IVPB 20 mg premix (0 mg Intravenous Stopped 02/10/18 0757)  sodium chloride 0.9 % bolus 1,000 mL (0 mLs Intravenous Stopped 02/10/18 1019)  potassium chloride SA (K-DUR,KLOR-CON) CR tablet 60 mEq (60 mEq Oral Given 02/10/18 0929)  sodium chloride 0.9 % bolus 1,000 mL (0 mLs Intravenous Stopped 02/10/18 1254)     Initial Impression / Assessment and Plan / ED Course  I have reviewed the triage vital signs and the nursing notes.  Pertinent labs & imaging results that were available during my care of the patient were reviewed by me and considered in my medical decision making (see chart for details).  Clinical Course as of Feb 10 1513  Sun Feb 10, 2018  [id number] OB Rapid Response asked for bedside US to confirm patient's pregnancy status before labs back. Dr. Lynelle DoctorKnapp entered procedure note performed by me- see his procedure note. OBR[facility] also requested official US considering no prenatal care. Patient does have an upcoming appointment, however, so will hold off on other prenatal testing, per their recommendation.    [AL]  [id number] On reassessment, patient's lip swelling tongue swelling has reduced significantly.  Labs and OB ultrasound pending.   [AL]    Clinical Course User Index [AL] Emi HolesLaw, Shayan Bramhall M, PA-C    Patient with anaphylaxis to unknown allergen.  Patient feeling much better and returning to baseline after 2 epinephrine given in the field, Solu-Medrol, Benadryl, Pepcid.  Patient with mild hypokalemia which was replaced in the ED.  Leukocytosis at 22.5, which probably  related to epinephrine.  UA is negative.  Patient mildly hypotensive throughout ED course, however patient states she is always around 90/60 when she is pregnant.  This  was confirmed in her chart.  Patient's OB ultrasound showed 20-week, 2-day intrauterine pregnancy.  Patient plans to follow-up with prenatal care and is waiting for callback.  She will call tomorrow.  Will discharge home with 5-day burst of prednisone, Pepcid, Benadryl, and EpiPen.  Strict return precautions given.  Patient understands and agrees with plan.  Patient discharged in satisfactory condition.  Patient also evaluated by Dr. Lynelle Doctor who agrees with plan.  Final Clinical Impressions(s) / ED Diagnoses   Final diagnoses:  Anaphylaxis, initial encounter    ED Discharge Orders         Ordered    EPINEPHrine 0.3 mg/0.3 mL IJ SOAJ injection   Once     02/10/18 1226    predniSONE (DELTASONE) 20 MG tablet  Daily     02/10/18 1226    famotidine (PEPCID) 20 MG tablet  2 times daily     02/10/18 1226    diphenhydrAMINE (BENADRYL) 25 MG tablet  Every 6 hours     02/10/18 1226           Johaan Ryser, [place], PA-C 02/10/18 1514    Linwood Dibbles, MD 02/10/18 2007

## 2018-02-10 NOTE — Progress Notes (Signed)
Spoke with Dr. Macon LargeAnyanwu. Pt is here because she is having a allergic reaction. Her tongue has been swelling. Pt is stable, no prenatal care. An informal bedside ultrasound shows a FHR of 136 BPM and a gestational age of 21/4/7 weeks. No vaginal bleeding or leaking of fluid. [facility] The ED MD has ordered a OB Limited ultrasound. Pt is OB cleared.

## 2018-02-10 NOTE — Discharge Instructions (Addendum)
Take prednisone daily until completed for the next 5 days.  Take Pepcid twice daily for the next 5 days.  Take Benadryl every 6 hours for the next 5 days.  Use EpiPen if you develop any difficulty breathing, swelling of your lips, tongue, throat, or recurrence of symptoms you experienced today.  Proceed to nearest emergency department if you ever have to use the EpiPen again.  I recommend he follow-up with an allergist for further evaluation and treatment of your reaction today.  Please return the emergency department if you develop any new or worsening symptoms.

## 2018-02-14 ENCOUNTER — Other Ambulatory Visit: Payer: Self-pay | Admitting: Obstetrics & Gynecology

## 2018-02-14 DIAGNOSIS — Z363 Encounter for antenatal screening for malformations: Secondary | ICD-10-CM

## 2018-02-18 ENCOUNTER — Encounter: Payer: Self-pay | Admitting: Obstetrics & Gynecology

## 2018-02-18 ENCOUNTER — Other Ambulatory Visit: Payer: Medicaid Other

## 2018-02-22 ENCOUNTER — Telehealth: Payer: Self-pay | Admitting: Women's Health

## 2018-02-22 NOTE — Telephone Encounter (Signed)
Patient called stating that she missed her appointment on 9/30 because her father passed away. Pt states that she will be here on her following appointment on 10/8. 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 Pt states since that day she has had the epi-pen sitting at the pharmacy in [place]. Pt states that her Pregnancy medicaid doesn't  Cover Epi-pen. Pt states that she can't afford it and would like to speak with a nurse to see what she could do.

## 2018-02-22 NOTE — Telephone Encounter (Signed)
Patient stated she was able to get discount card for epi pen but it is still $105.  Advised patient to obviously avoid peanuts and maybe contact pharmacy or HD to see if there was some sort of financial support she could get for her to have this during pregnancy.  Verbalized understanding.

## 2018-02-26 ENCOUNTER — Ambulatory Visit: Payer: Medicaid Other | Admitting: *Deleted

## 2018-02-26 ENCOUNTER — Encounter: Payer: Medicaid Other | Admitting: Advanced Practice Midwife

## 2018-09-24 ENCOUNTER — Telehealth: Payer: Self-pay | Admitting: *Deleted

## 2018-09-24 ENCOUNTER — Telehealth: Payer: Self-pay | Admitting: Gastroenterology

## 2018-09-24 ENCOUNTER — Other Ambulatory Visit: Payer: Self-pay

## 2018-09-24 ENCOUNTER — Encounter: Payer: Self-pay | Admitting: Gastroenterology

## 2018-09-24 ENCOUNTER — Ambulatory Visit: Payer: Medicaid Other | Admitting: Gastroenterology

## 2018-09-24 NOTE — Telephone Encounter (Signed)
done

## 2018-09-24 NOTE — Telephone Encounter (Signed)
Called patient for virtual visit with LSL today and I had to leave a message. No return call back prior to 9:45am. Please No SHow. Thanks

## 2018-09-24 NOTE — Telephone Encounter (Signed)
Patient was a no show/no answer and letter sent  °

## 2018-10-04 ENCOUNTER — Ambulatory Visit: Payer: Self-pay | Admitting: Gastroenterology

## 2018-11-05 ENCOUNTER — Encounter: Payer: Self-pay | Admitting: Nurse Practitioner

## 2018-11-05 ENCOUNTER — Other Ambulatory Visit: Payer: Self-pay

## 2018-11-05 ENCOUNTER — Telehealth: Payer: Self-pay | Admitting: *Deleted

## 2018-11-05 ENCOUNTER — Ambulatory Visit (INDEPENDENT_AMBULATORY_CARE_PROVIDER_SITE_OTHER): Payer: Medicaid Other | Admitting: Nurse Practitioner

## 2018-11-05 ENCOUNTER — Encounter: Payer: Self-pay | Admitting: *Deleted

## 2018-11-05 DIAGNOSIS — B1920 Unspecified viral hepatitis C without hepatic coma: Secondary | ICD-10-CM | POA: Insufficient documentation

## 2018-11-05 DIAGNOSIS — B182 Chronic viral hepatitis C: Secondary | ICD-10-CM

## 2018-11-05 NOTE — Progress Notes (Signed)
[facility]:  Dr. Darrick PennaFields  Chief Complaint  Patient presents with  . Hepatitis C    HPI:   Marisa Kirby is a 38 y.o. female who presents on referral from primary care for evaluation of hepatitis C.  Reviewed information provided with referral including lab results dated 07/17/2018 which showed positive hepatitis C antibody and positive qualitative hepatitis C RNA..  No history of colonoscopy or endoscopy in our system.  Today she states she's not doing well. Having progressive weakness, bowel movements every 3-4 days, has a "lot of problems with infections" but hasn't seen anyone for this. Doesn't have a PCP. This is first positive HCV test. Has abdominal bloating and gas. Denies abdominal pain. Had N/V when she didn't go to the bathroom for "days and days." Admits hematochezia, most recently this past weekend; intermittently/rarely before then. Has pitch black tarry stools. Denies fever, chills, unintentional weight loss. Denies URI or flu-like symptoms. Denies loss of sense of taste or smell. Has been having weakness and dyspnea since childbirth recently. Cannot sleep. States she can't go anywhere to be seen because she doesn't have a ride. Denies chest pain, dizziness, lightheadedness, syncope, near syncope. Denies any other upper or lower GI symptoms.  Takes Tylenol "I take 3 or 4 of them every 6 hours." She is not sure of the dose. This likely represents as much as 4g - 6g a day. States she takes this much Tylenol due to cluster headaches that she has had for the last 3 years. Has not been able to find a PCP or visit the health department for these in 3 years due to transportation, childbirths, and financial difficulties.  Hepatitis C Risk Factors:  Birth cohort [phone](1944 - 1964): No IV drug use: Yes, last use Spring 2019 Tattoos: Yes, 1 last year not in a shop Blood product transfusion: No HC worker: No  Hemodialysis: No Maternal infection: Yes (but not when she was pregnant with her)   Past Medical History:  Diagnosis Date  . Hepatitis C   . Kidney stones   . Pregnancy with adoption planned 03/21/2017    [facility] Care at  25wk FOB Rubbie Battiesthristopher Curry Dating By 2nd trimester U/S 25wk Pap 2018 neg GC/CT Initial:  -/-              36+wks: Genetic Screen Too late CF screen declined Anatomic US Normal female Flu vaccine 03/21/17  Tdap Recommended ~ 28wks Glucose Screen  2 hr87/108/83 GBS  Feed Preference bottle Contrace[facility] B[facility]  . Substance abuse [facility](HCC)     Past Surgical History:  Procedure Laterality Date  . LITHOTRIPSY    . STENT PLACE LEFT URETER ([facility] HX)    . STENT REMOVAL    . TONSILLECTOMY      Current Outpatient Medications  Medication Sig Dispense Refill  . acetaminophen (TYLENOL) 500 MG tablet Take 500 mg by mouth every 6 (six) hours as needed.    . diphenhydrAMINE (BENADRYL) 25 MG tablet Take 1 tablet (25 mg total) by mouth every 6 (six) hours. (Patient not taking: Reported on 11/05/2018) 20 tablet 0  . famotidine (PEPCID) 20 MG tablet Take 1 tablet (20 mg total) by mouth 2 (two) times daily. (Patient not taking: Reported on 02/10/2018) 30 tablet 2  . famotidine (PEPCID) 20 MG tablet Take 1 tablet (20 mg total) by mouth 2 (two) times daily. (Patient not  taking: Reported on 11/05/2018) 30 tablet 0  . ibuprofen (ADVIL,MOTRIN) 600 MG tablet Take 1 tablet (600 mg total) by mouth every 6 (six) hours as needed. (Patient not taking: Reported on 02/10/2018) 30 tablet 0  . predniSONE (DELTASONE) 20 MG tablet Take 3 tablets (60 mg total) by mouth daily. (Patient not taking: Reported on 11/05/2018) 15 tablet 0  . Prenatal Multivit-Min-Fe-FA (PRENATAL VITAMINS PO) Take 1 tablet by mouth daily.     No current facility-administered medications for this visit.     Allergies as of 11/05/2018 - Review Complete 11/05/2018   Allergen Reaction Noted  . Tramadol Hives 05/29/2011    Family History  Adopted: Yes  Problem Relation Age of Onset  . Kidney disease Mother   . Heart disease Mother   . Obesity Mother   . Depression Mother   . Congestive Heart Failure Maternal Grandmother   . Colon cancer Neg Hx     Social History   Socioeconomic History  . Marital status: Single    Spouse name: Not on file  . Number of children: Not on file  . Years of education: Not on file  . Highest education level: Not on file  Occupational History  . Not on file  Social Needs  . Financial resource strain: Not on file  . Food insecurity    Worry: Not on file    Inability: Not on file  . Transportation needs    Medical: Not on file    Non-medical: Not on file  Tobacco Use  . Smoking status: Current Every Day Smoker    Packs/day: 0.25    Types: Cigarettes  . Smokeless tobacco: Never Used  . Tobacco comment: 5 cigs per day  Substance and Sexual Activity  . Alcohol use: Yes    Comment: social, every 3-4 months  . Drug use: Yes    Types: Marijuana, IV    Comment: As of 11/05/18: last used yesterday; previous IV drug use, last in Spring 2019.  Marland Kitchen Sexual activity: Yes    Birth control/protection: None  Lifestyle  . Physical activity    Days per week: Not on file    Minutes per session: Not on file  . Stress: Not on file  Relationships  . Social Herbalist on phone: Not on file    Gets together: Not on file    Attends religious service: Not on file    Active member of club or organization: Not on file    Attends meetings of clubs or organizations: Not on file    Relationship status: Not on file  . Intimate partner violence    Fear of current or ex partner: Not on file    Emotionally abused: Not on file    Physically abused: Not on file    Forced sexual activity: Not on file  Other Topics Concern  . Not on file  Social History Narrative  . Not on file    Review of Systems: Complete ROS  negative except as per HPI.    Physical Exam: BP 116/64   Pulse 70   Temp (!) 97 F (36.1 C) (Oral)   Ht 5\' 3"  (1.6 m)   Wt 161 lb 9.6 oz (73.3 kg)   LMP 10/14/2018   BMI 28.63 kg/m  General:   Alert and oriented. Pleasant and cooperative. Well-nourished and well-developed.  Head:  Normocephalic and atraumatic. Eyes:  Without icterus, sclera clear and conjunctiva pink.  Ears:  Normal auditory acuity.  Cardiovascular:  S1, S2 present without murmurs appreciated. Extremities without clubbing or edema. Respiratory:  Clear to auscultation bilaterally. No wheezes, rales, or rhonchi. No distress.  Gastrointestinal:  +BS, soft, non-tender and non-distended. No HSM noted. No guarding or rebound. No masses appreciated.  Rectal:  Deferred  Musculoskalatal:  Symmetrical without gross deformities. Skin:  Intact without significant lesions or rashes. Neurologic:  Alert and oriented x4;  grossly normal neurologically. Psych:  Alert and cooperative. Normal mood and affect. Heme/Lymph/Immune: No excessive bruising noted.    11/05/2018 9:09 AM   Disclaimer: This note was dictated with voice recognition software. Similar sounding words can inadvertently be transcribed and may not be corrected upon review.

## 2018-11-05 NOTE — Assessment & Plan Note (Signed)
The patient was seen in the health department for hepatitis C test which came back positive.  Qualitative confirmation with RNA.  At this point we will proceed with hepatitis C work-up including CBC, CMP, hepatitis C quantitative RNA, hepatitis C genotype, hepatitis A and B serologies, HIV.  We will need to discuss contraception if she does end up treating.  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  She just gave birth within the last year and is asking about hepatitis C testing for her infant.  I told her that we do not treat pediatrics but I would try to obtain a recommendation for her.  Per up-to-date hepatitis C screening is appropriate for infants/children with positive maternal infection but only after 37 months of age due to possible transfer of maternal antibodies that are still present at less than 18 months.  Hepatitis C Treatment Checklist  HCV RNA:  HCV GT:  Elastography:  Tx history:  Hepatitis A serologies:  Hepatitis B serologies:  HIV Serologies:  Contraindicated Medications:  Contraception:

## 2018-11-05 NOTE — Patient Instructions (Signed)
Your health issues we discussed today were:   Positive hepatitis C: 1. Have your blood test drawn as soon as you can 2. We will call you when we get the results 3. The radiology department will call you for scheduling of your ultrasound 4. As we discussed, you should limit the amount of Tylenol you take to a maximum of 3000 mg a day 5. I researched testing for children and generally testing of babies and children is appropriate for parents with positive hepatitis C, but this should only be done after 21 months of age.  If it is done before this there can be a false positive because of the antibodies the baby received from mom while she was pregnant  Overall I recommend:  1. Try to visit the health department or establish a visit with a primary care to discuss your headaches, weakness, fatigue 2. Follow-up in 3 months 3. Call us if you have any questions or concerns.   Because of recent events of COVID-19 ("Coronavirus"), follow CDC recommendations:  Wash your hand frequently Avoid touching your face Stay away from people who are sick If you have symptoms such as fever, cough, shortness of breath then call your healthcare provider for further guidance If you are sick, STAY AT HOME unless otherwise directed by your healthcare provider. Follow directions from state and national officials regarding staying safe    It was great to see you today!  I hope you have a great summer!!

## 2018-11-05 NOTE — Progress Notes (Signed)
cc'ed to pcp °

## 2018-11-05 NOTE — Telephone Encounter (Signed)
PA approved via [employer] website for ultrasound. Auth# [id number] dates 11/05/2018-05/04/2019

## 2018-11-07 ENCOUNTER — Emergency Department (HOSPITAL_COMMUNITY): Payer: Medicaid Other

## 2018-11-07 ENCOUNTER — Emergency Department (HOSPITAL_COMMUNITY)
Admission: EM | Admit: 2018-11-07 | Discharge: 2018-11-07 | Disposition: A | Discharge status: Home / Self Care | Payer: Medicaid Other | Attending: Emergency Medicine | Admitting: Emergency Medicine

## 2018-11-07 ENCOUNTER — Other Ambulatory Visit: Payer: Self-pay

## 2018-11-07 ENCOUNTER — Encounter (HOSPITAL_COMMUNITY): Payer: Self-pay | Admitting: Emergency Medicine

## 2018-11-07 DIAGNOSIS — F17210 Nicotine dependence, cigarettes, uncomplicated: Secondary | ICD-10-CM | POA: Diagnosis not present

## 2018-11-07 DIAGNOSIS — R1032 Left lower quadrant pain: Secondary | ICD-10-CM | POA: Diagnosis present

## 2018-11-07 DIAGNOSIS — Z20828 Contact with and (suspected) exposure to other viral communicable diseases: Secondary | ICD-10-CM | POA: Insufficient documentation

## 2018-11-07 DIAGNOSIS — N390 Urinary tract infection, site not specified: Secondary | ICD-10-CM | POA: Insufficient documentation

## 2018-11-07 LAB — CBC WITH DIFFERENTIAL/PLATELET
Abs Immature Granulocytes: 0.01 10*3/uL (ref 0.00–0.07)
Basophils Absolute: 0.1 10*3/uL (ref 0.0–0.1)
Basophils Relative: 1 %
Eosinophils Absolute: 0.1 10*3/uL (ref 0.0–0.5)
Eosinophils Relative: 1 %
HCT: 42.7 % (ref 36.0–46.0)
Hemoglobin: 14.1 g/dL (ref 12.0–15.0)
Immature Granulocytes: 0 %
Lymphocytes Relative: 31 %
Lymphs Abs: 2.4 10*3/uL (ref 0.7–4.0)
MCH: 29.4 pg (ref 26.0–34.0)
MCHC: 33 g/dL (ref 30.0–36.0)
MCV: 89 fL (ref 80.0–100.0)
Monocytes Absolute: 0.6 10*3/uL (ref 0.1–1.0)
Monocytes Relative: 7 %
Neutro Abs: 4.5 10*3/uL (ref 1.7–7.7)
Neutrophils Relative %: 60 %
Platelets: 260 10*3/uL (ref 150–400)
RBC: 4.8 MIL/uL (ref 3.87–5.11)
RDW: 13 % (ref 11.5–15.5)
WBC: 7.7 10*3/uL (ref 4.0–10.5)
nRBC: 0 % (ref 0.0–0.2)

## 2018-11-07 LAB — URINALYSIS, ROUTINE W REFLEX MICROSCOPIC
Bilirubin Urine: NEGATIVE
Glucose, UA: NEGATIVE mg/dL
Hgb urine dipstick: NEGATIVE
Ketones, ur: 5 mg/dL — AB
Leukocytes,Ua: NEGATIVE
Nitrite: NEGATIVE
Protein, ur: NEGATIVE mg/dL
Specific Gravity, Urine: 1.019 (ref 1.005–1.030)
pH: 7 (ref 5.0–8.0)

## 2018-11-07 LAB — COMPREHENSIVE METABOLIC PANEL
ALT: 33 U/L (ref 0–44)
AST: 24 U/L (ref 15–41)
Albumin: 3.6 g/dL (ref 3.5–5.0)
Alkaline Phosphatase: 43 U/L (ref 38–126)
Anion gap: 7 (ref 5–15)
BUN: 11 mg/dL (ref 6–20)
CO2: 21 mmol/L — ABNORMAL LOW (ref 22–32)
Calcium: 9.1 mg/dL (ref 8.9–10.3)
Chloride: 109 mmol/L (ref 98–111)
Creatinine, Ser: 0.47 mg/dL (ref 0.44–1.00)
GFR calc Af Amer: >60 mL/min (ref >60)
GFR calc non Af Amer: >60 mL/min (ref >60)
Glucose, Bld: 99 mg/dL (ref 70–99)
Potassium: 3.4 mmol/L — ABNORMAL LOW (ref 3.5–5.1)
Sodium: 137 mmol/L (ref 135–145)
Total Bilirubin: 0.5 mg/dL (ref 0.3–1.2)
Total Protein: 6.8 g/dL (ref 6.5–8.1)

## 2018-11-07 LAB — SARS CORONAVIRUS 2 BY RT PCR (HOSPITAL ORDER, PERFORMED IN ~~LOC~~ HOSPITAL LAB): SARS Coronavirus 2: NEGATIVE

## 2018-11-07 LAB — LACTIC ACID, PLASMA: Lactic Acid, Venous: 0.9 mmol/L (ref 0.5–1.9)

## 2018-11-07 LAB — I-STAT BETA HCG BLOOD, ED (MC, WL, AP ONLY): I-stat hCG, quantitative: <5 m[IU]/mL (ref <5)

## 2018-11-07 MED ORDER — SODIUM CHLORIDE 0.9 % IV SOLN
1.0000 g | Freq: Once | INTRAVENOUS | Status: AC
  Administered 2018-11-07: 1 g via INTRAVENOUS
  Filled 2018-11-07: qty 10

## 2018-11-07 MED ORDER — SODIUM CHLORIDE 0.9 % IV BOLUS
1000.0000 mL | Freq: Once | INTRAVENOUS | Status: AC
  Administered 2018-11-07: 1000 mL via INTRAVENOUS

## 2018-11-07 MED ORDER — FENTANYL CITRATE (PF) 100 MCG/2ML IJ SOLN
25.0000 ug | Freq: Once | INTRAMUSCULAR | Status: AC
  Administered 2018-11-07: 25 ug via INTRAVENOUS
  Filled 2018-11-07: qty 2

## 2018-11-07 MED ORDER — CEPHALEXIN 500 MG PO CAPS: 500.0000 mg | ORAL_CAPSULE | Freq: Three times a day (TID) | ORAL | 0 refills | Status: AC

## 2018-11-07 NOTE — ED Notes (Signed)
Pt ambulated to restroom with no distress noted at this time.

## 2018-11-07 NOTE — ED Triage Notes (Signed)
Patient report fever (of 100), cough, sore throat, and body aches since Monday.

## 2018-11-07 NOTE — ED Provider Notes (Signed)
[facility] Provider Note   CSN: [id number] Arrival date & time: 11/07/18  1546    History   Chief Complaint Chief Complaint  Patient presents with   Weakness    HPI Rhett BannisterVickie R Lavigne is a 38 y.o. female.     HPI Patient presents with 4 days of fever, chills, diffuse body aches, scratchy throat, dry cough.  Patient states that before all this began she was having urinary urgency and frequency.  She now complains of left flank pain. No shortness of breath or chest pain.  Currently denying sore throat or difficulty swallowing.  No new rashes.  No known coronavirus exposure. Past Medical History:  Diagnosis Date   Hepatitis C    Kidney stones    Pregnancy with adoption planned 03/21/2017    [facility]iated Care at  25wk FOB Rubbie Battiesthristopher Curry Dating By 2nd trimester U/S 25wk Pap 2018 neg GC/CT Initial:  -/-              36+wks: Genetic Screen Too late CF screen declined Anatomic US Normal female Flu vaccine 03/21/17  Tdap Recommended ~ 28wks Glucose Screen  2 hr87/108/83 GBS  Feed Preference bottle [facility]race[facility]   Substance abuse [facility](HCC)     Patient Active Problem List   Diagnosis Date Noted   Hepatitis C 11/05/2018   Preterm labor in third trimester 06/08/2017   SVD (spontaneous vaginal delivery) 06/08/2017   Polysubstance abuse (HCC) 03/22/2017   Supervision of normal pregnancy 03/21/2017   Narcotic abuse (HCC) 03/21/2017   Late prenatal care in second trimester 03/21/2017   Left ureteral stone 09/16/2015    Past Surgical History:  Procedure Laterality Date   LITHOTRIPSY     STENT PLACE LEFT URETER ([facility] HX)     STENT REMOVAL     TONSILLECTOMY       OB History    Gravida  11   Para  3   Term  2   Preterm  1   AB  6   Living  3     SAB  3   TAB  3   Ectopic      Multiple  0   Live Births  3            Home Medications    Prior to  Admission medications   Medication Sig Start Date End Date Taking? Authorizing Provider  cephALEXin (KEFLEX) 500 MG capsule Take 1 capsule (500 mg total) by mouth 3 (three) times daily. 11/07/18   Loren RacerYelverton, Aundrea Horace, MD    Family History Family History  Adopted: Yes  Problem Relation Age of Onset   Kidney disease Mother    Heart disease Mother    Obesity Mother    Depression Mother    Congestive Heart Failure Maternal Grandmother    Colon cancer Neg Hx     Social History Social History   Tobacco Use   Smoking status: Current Every Day Smoker    Packs/day: 0.25    Types: Cigarettes   Smokeless tobacco: Never Used   Tobacco comment: 5 cigs per day  Substance Use Topics   Alcohol use: Yes    Comment: social, every 3-4 months   Drug use: Yes    Types: Marijuana, IV    Comment: As of 11/05/18: last used yesterday; previous IV drug use, last in Spring 2019.     Allergies   Tramadol   Review of  Systems Review of Systems  Constitutional: Positive for chills, fatigue and fever.  HENT: Negative for sore throat, trouble swallowing and voice change.   Respiratory: Positive for cough. Negative for shortness of breath.   Cardiovascular: Negative for chest pain.  Gastrointestinal: Positive for abdominal pain. Negative for constipation, diarrhea, nausea and vomiting.  Genitourinary: Positive for flank pain and urgency. Negative for dysuria, frequency and vaginal bleeding.  Musculoskeletal: Positive for back pain and myalgias. Negative for neck pain and neck stiffness.  Skin: Negative for rash and wound.  Neurological: Negative for dizziness, weakness, light-headedness, numbness and headaches.  All other systems reviewed and are negative.    Physical Exam Updated Vital Signs BP 109/66    Pulse (!) 59    Temp 97.9 F (36.6 C) (Oral)    Resp (!) 22    Ht 5\' 3"  (1.6 m)    Wt 75.8 kg    LMP 10/14/2018    SpO2 97%    Breastfeeding Unknown Comment: neg   BMI 29.58 kg/m    Physical Exam Vitals signs and nursing note reviewed.  Constitutional:      Appearance: Normal appearance. She is well-developed.  HENT:     Head: Normocephalic and atraumatic.     Nose: Nose normal.     Mouth/Throat:     Mouth: Mucous membranes are moist.     Pharynx: No oropharyngeal exudate or posterior oropharyngeal erythema.  Eyes:     Extraocular Movements: Extraocular movements intact.     Pupils: Pupils are equal, round, and reactive to light.  Neck:     Musculoskeletal: Normal range of motion and neck supple. No neck rigidity or muscular tenderness.     Comments: No meningismus Cardiovascular:     Rate and Rhythm: Normal rate and regular rhythm.     Heart sounds: No murmur. No friction rub. No gallop.   Pulmonary:     Effort: Pulmonary effort is normal. No respiratory distress.     Breath sounds: Normal breath sounds. No stridor. No wheezing, rhonchi or rales.  Chest:     Chest wall: No tenderness.  Abdominal:     General: Bowel sounds are normal.     Palpations: Abdomen is soft.     Tenderness: There is abdominal tenderness. There is left CVA tenderness. There is no guarding or rebound.     Comments: Tenderness to deep palpation in the left upper quadrant.  No rebound or guarding.  Left CVA tenderness to percussion.  Musculoskeletal: Normal range of motion.        General: Tenderness present.     Right lower leg: No edema.     Left lower leg: No edema.  Lymphadenopathy:     Cervical: No cervical adenopathy.  Skin:    General: Skin is warm and dry.     Capillary Refill: Capillary refill takes less than 2 seconds.     Findings: No erythema or rash.  Neurological:     General: No focal deficit present.     Mental Status: She is alert and oriented to person, place, and time.  Psychiatric:        Behavior: Behavior normal.      ED Treatments / Results  Labs (all labs ordered are listed, but only abnormal results are displayed) Labs Reviewed  COMPREHENSIVE  METABOLIC PANEL - Abnormal; Notable for the following components:      Result Value   Potassium 3.4 (*)    CO2 21 (*)    All other  components within normal limits  URINALYSIS, ROUTINE W REFLEX MICROSCOPIC - Abnormal; Notable for the following components:   APPearance TURBID (*)    Ketones, ur 5 (*)    Bacteria, UA FEW (*)    All other components within normal limits  CULTURE, BLOOD (ROUTINE X 2)  CULTURE, BLOOD (ROUTINE X 2)  SARS CORONAVIRUS 2 (HOSPITAL ORDER, [place] [facility])  CBC WITH DIFFERENTIAL/PLATELET  LACTIC ACID, PLASMA  I-STAT BETA HCG BLOOD, ED ([facility], [facility], [facility] ONLY)    EKG None  Radiology Dg Chest Port 1 View  Result Date: 11/07/2018 CLINICAL DATA:  Cough EXAM: PORTABLE CHEST 1 VIEW COMPARISON:  Chest x-ray dated August 09, 2015. FINDINGS: The heart size and mediastinal contours are within normal limits. Both lungs are clear. The visualized skeletal structures are unremarkable. There is likely calcific tendinosis of the supraspinatus on the left. IMPRESSION: No active disease. Electronically Signed   By: Constance Holster M.D.   On: 11/07/2018 17:23   Ct Renal Stone Study  Result Date: 11/07/2018 CLINICAL DATA:  Flank pain EXAM: CT ABDOMEN AND PELVIS WITHOUT CONTRAST TECHNIQUE: Multidetector CT imaging of the abdomen and pelvis was performed following the standard protocol without IV contrast. COMPARISON:  CT abdomen pelvis 09/26/2015 FINDINGS: Lower chest: Lung bases clear bilaterally. Hepatobiliary: Normal liver. Gallbladder contracted with mild gallbladder wall thickening. No biliary dilatation. Pancreas: Negative Spleen: Negative Adrenals/Urinary Tract: Small bilateral renal calculi. No renal obstruction and no ureteral stone. Normal bladder. Stomach/Bowel: No dilated bowel loops.  No bowel edema or mass. Vascular/Lymphatic: Mild atherosclerotic aorta. Negative for adenopathy. Reproductive: No pelvic mass. Other: No free fluid Musculoskeletal: Negative  IMPRESSION: Small nonobstructing renal calculi bilaterally. No hydronephrosis. Normal bladder. Gallbladder wall thickening may be due to incomplete distention. Electronically Signed   By: Franchot Gallo M.D.   On: 11/07/2018 19:33    Procedures Procedures (including critical care time)  Medications Ordered in ED Medications  sodium chloride 0.9 % bolus 1,000 mL (0 mLs Intravenous Stopped 11/07/18 1945)  fentaNYL (SUBLIMAZE) injection 25 mcg (25 mcg Intravenous Given 11/07/18 1731)  cefTRIAXone (ROCEPHIN) 1 g in sodium chloride 0.9 % 100 mL IVPB (1 g Intravenous New Bag/Given 11/07/18 2113)     Initial Impression / Assessment and Plan / ED Course  I have reviewed the triage vital signs and the nursing notes.  Pertinent labs & imaging results that were available during my care of the patient were reviewed by me and considered in my medical decision making (see chart for details).        Suspect pyelonephritis.  Patient does have history of renal stones.  Will get renal stone study to rule out infected stone. CT without evidence of renal stone.  Questionable gallbladder wall thickening but patient does not have tenderness at the site.  Low suspicion for cholecystitis.  Patient does have evidence of mild urinary tract infection.  Question ascending UTI.  Will give dose of IV Rocephin.  Patient remains well-appearing. Final Clinical Impressions(s) / ED Diagnoses   Final diagnoses:  Lower urinary tract infectious disease    ED Discharge Orders         Ordered    cephALEXin (KEFLEX) 500 MG capsule  3 times daily     11/07/18 2152           Julianne Rice, MD 11/07/18 2157

## 2018-11-12 ENCOUNTER — Ambulatory Visit (HOSPITAL_COMMUNITY): Payer: Medicaid Other

## 2018-11-12 LAB — CULTURE, BLOOD (ROUTINE X 2)
Culture: NO GROWTH
Culture: NO GROWTH
Special Requests: ADEQUATE
Special Requests: ADEQUATE

## 2018-11-12 NOTE — Progress Notes (Signed)
REVIEWED-NO ADDITIONAL RECOMMENDATIONS. 

## 2018-12-10 IMAGING — DX DG FOOT COMPLETE 3+V*L*
3 series · 3 of 3 positions shown · non-contrast
Comparison: None.

CLINICAL DATA: Altered four days ago. Severe fifth toe pain and
swelling.

EXAM:
LEFT FOOT - COMPLETE 3+ VIEW

[foot ap]
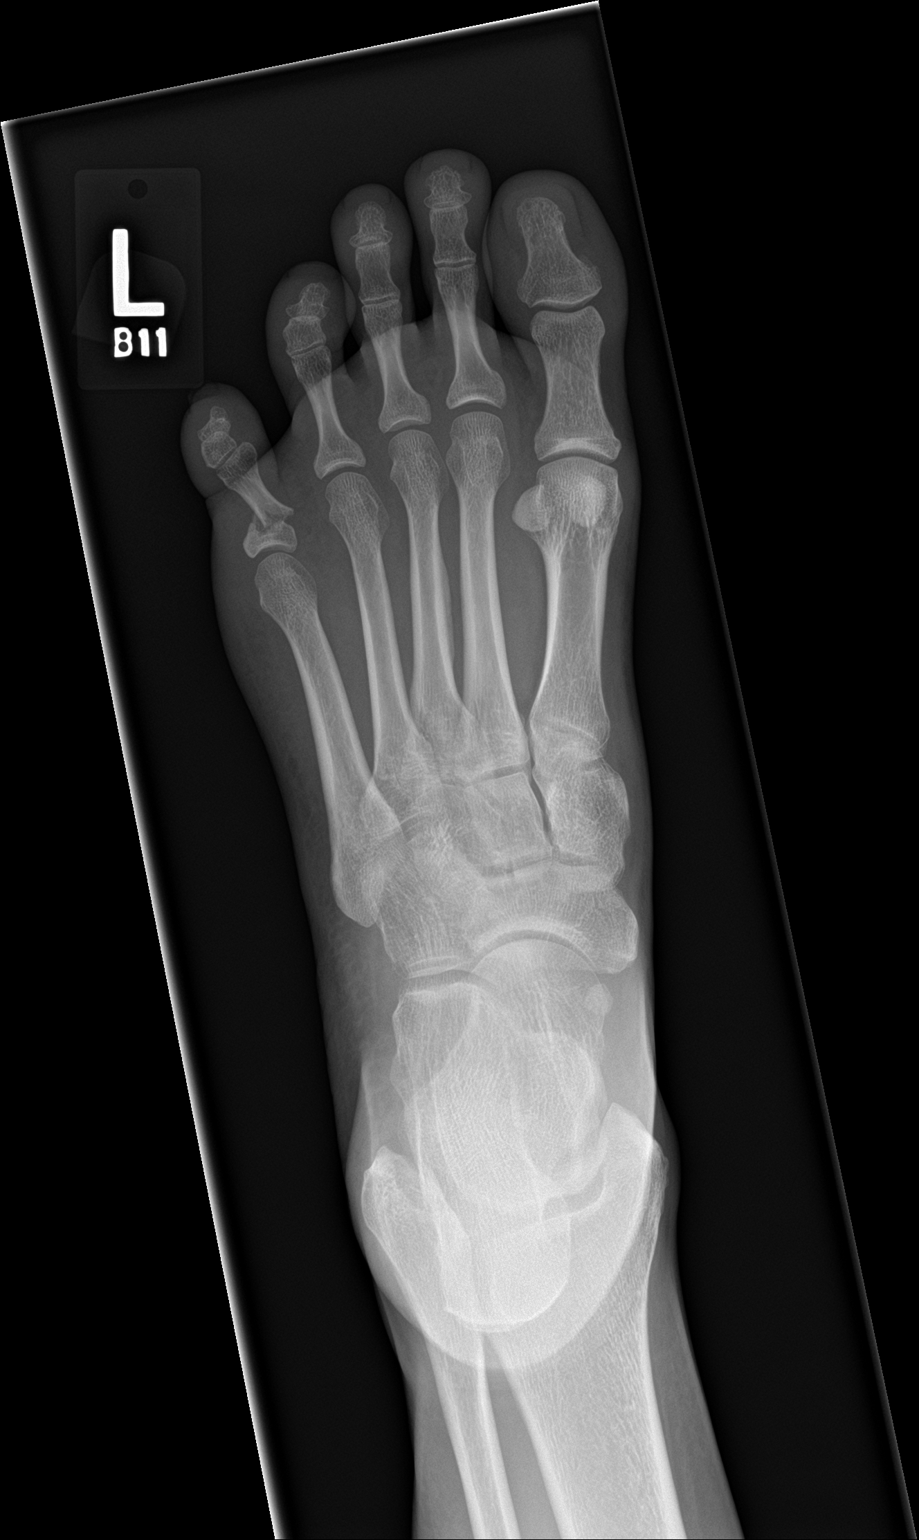

[foot obl]
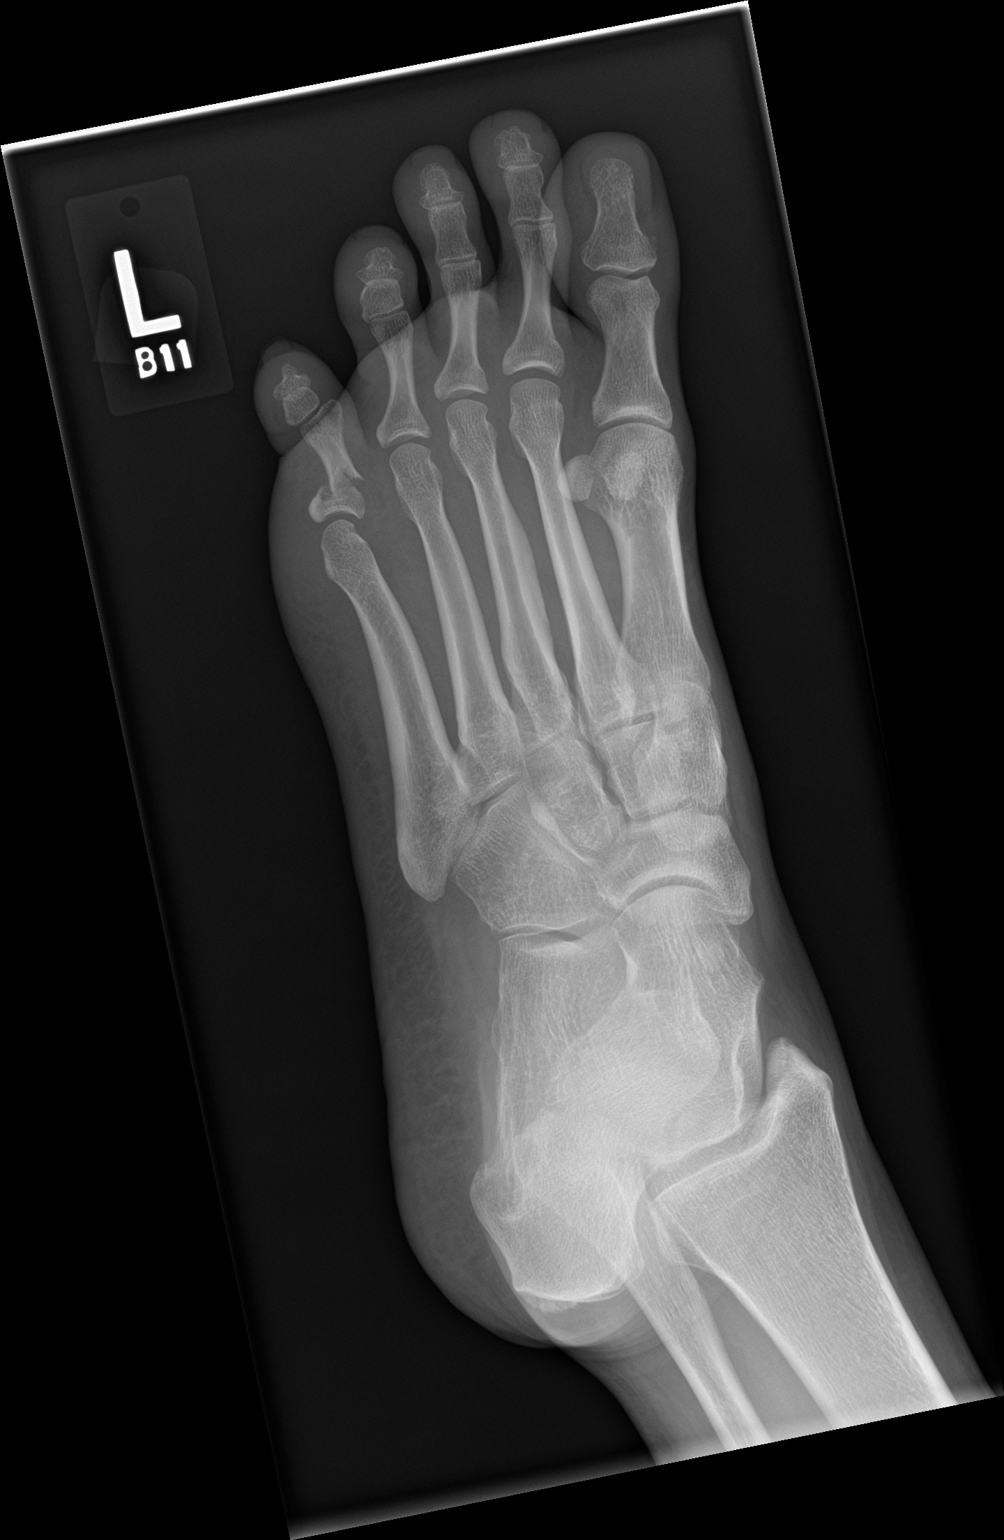

[foot lat]
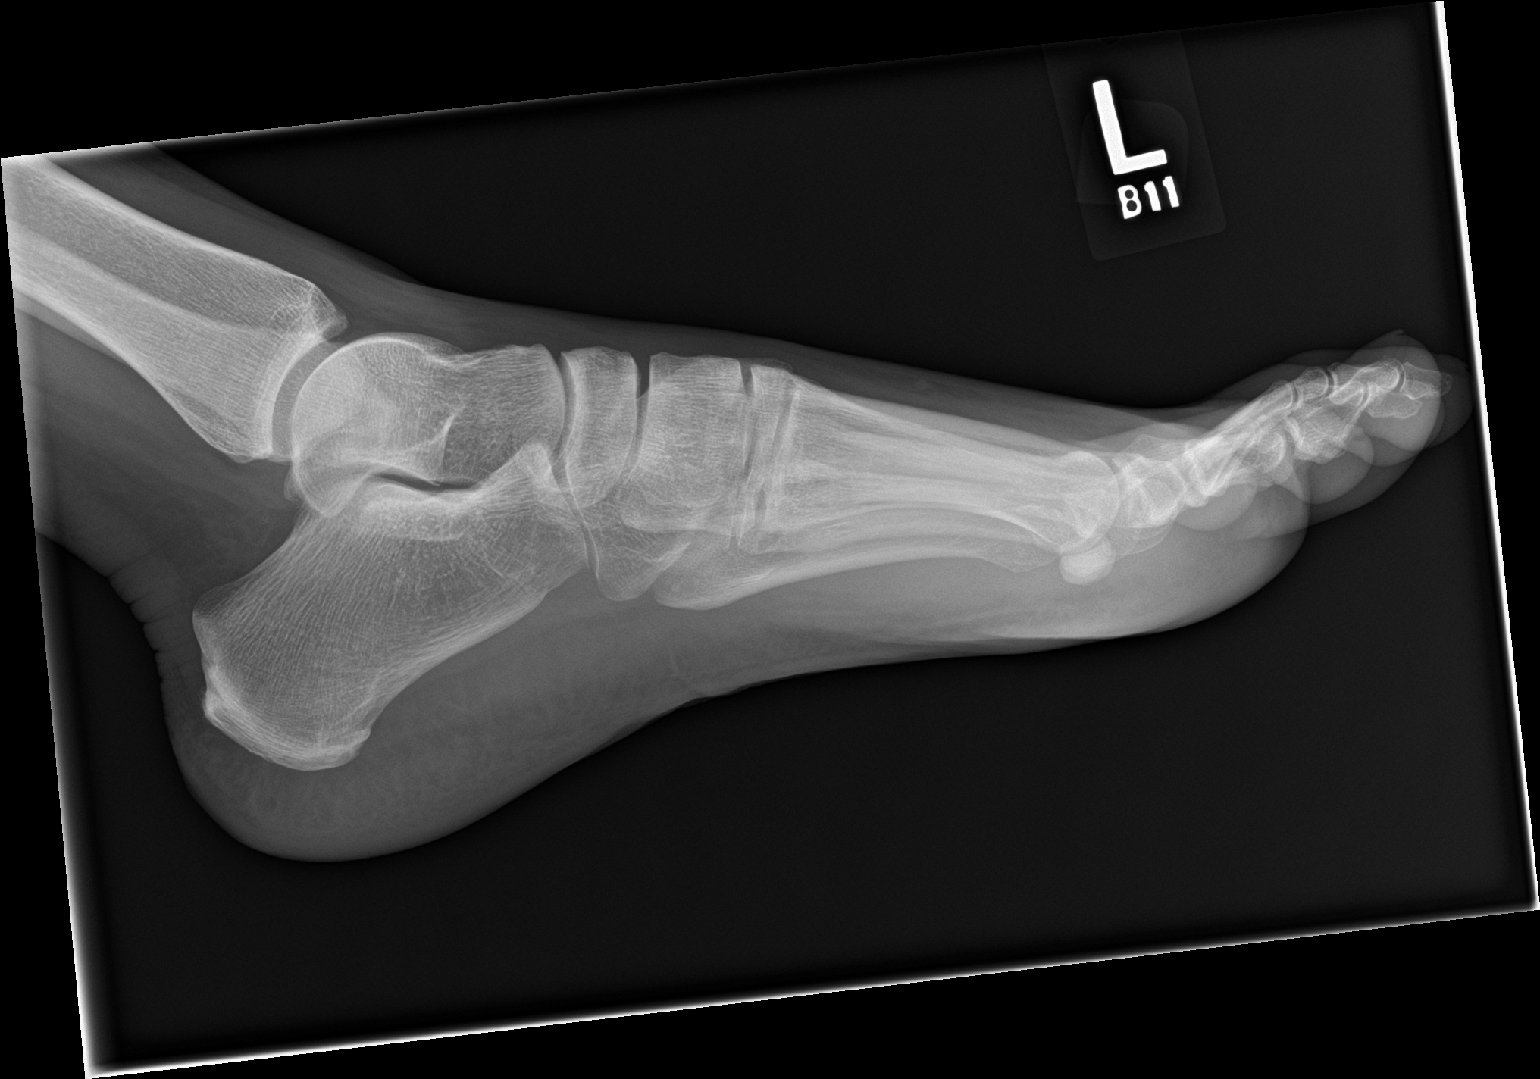

[3 of 3 positions shown; findings below may reference images not displayed]

FINDINGS: Acute impacted fifth proximal phalanx fracture with lateral
angulation of the distal bony fragments. No intra-articular
extension. No dislocation. No destructive bony lesions. Lateral
forefoot soft tissue swelling without subcutaneous gas or radiopaque
foreign bodies.
IMPRESSION: Acute displaced fifth proximal phalanx fracture.  No dislocation.

## 2019-02-06 ENCOUNTER — Ambulatory Visit: Payer: Medicaid Other | Admitting: Nurse Practitioner

## 2019-02-06 ENCOUNTER — Encounter: Payer: Self-pay | Admitting: Nurse Practitioner

## 2019-02-06 NOTE — Progress Notes (Deleted)
Referring Provider: Health, [facility] Lefort* Primary Care Physician:  Health, [facility] Primary GI:  Dr. Oneida Alar  No chief complaint on file.   HPI:   Marisa Kirby is a 38 y.o. female who presents for follow-up on chronic hepatitis C.  The patient was last seen in our office 11/05/2018 which was a referral from primary care for positive hep C antibody and qualitative hepatitis C RNA.  Her last visit she noted progressive weakness, bowel movements every 3 to 4 days and has "a lot of problems with infections" but has not seen anyone.  Does not have a primary care.  This is her first positive hep C test.  No abdominal pain.  Admits hematochezia and most recently this past weekend intermittently/rarely before then.  Also noted pitch black tarry stools.  No other GI complaints.  States she cannot go anywhere to be seen by care provider because she does not have a ride.  Takes "3-4 Tylenol every 6 hours" but is not sure of the exact dose of each pill.  Likely representing 4 to 6 g of Tylenol a day.  This is due to cluster headaches.  Has been unable to find a primary care or visit to health department in the past 3 years due to transportation, childbirths, and financial difficulties.  Risk factors: Admits IV drug use most recently in spring 2019 and has 1 "home tattoo".  Pended hepatitis C work-up including labs, ultrasound elastography.  Recommended keep Tylenol at a maximum of 3000 mg a day.  I recommended she establish with a primary care for visit to health department to discuss her headaches, weakness, fatigue.  Follow-up in 3 months.  They she states   Past Medical History:  Diagnosis Date   Hepatitis C    Kidney stones    Pregnancy with adoption planned 03/21/2017    [place] Initiated Care at  25wk FOB Emi Belfast Dating By 2nd trimester U/S 25wk Pap 2018 neg GC/CT Initial:  -/-              36+wks: Genetic Screen Too late CF screen declined Anatomic  [place] Normal female Flu vaccine 03/21/17  Tdap Recommended ~ 28wks Glucose Screen  2 hr87/108/83 GBS  Feed Preference bottle Contraception BTL Circumcision Yes at [facility] Child[facility] Classes declin   Substance abuse ([place])     Past Surgical History:  Procedure Laterality Date   LITHOTRIPSY     STENT PLACE LEFT URETER ([facility] HX)     STENT REMOVAL     TONSILLECTOMY      Current Outpatient Medications  Medication Sig Dispense Refill   cephALEXin (KEFLEX) 500 MG capsule Take 1 capsule (500 mg total) by mouth 3 (three) times daily. 30 capsule 0   No current facility-administered medications for this visit.     Allergies as of 02/06/2019 - Review Complete 11/07/2018  Allergen Reaction Noted   Tramadol Hives 05/29/2011    Family History  Adopted: Yes  Problem Relation Age of Onset   Kidney disease Mother    Heart disease Mother    Obesity Mother    Depression Mother    Congestive Heart Failure Maternal Grandmother    Colon cancer Neg Hx     Social History   Socioeconomic History   Marital status: Single    Spouse name: Not on file   Number of children: Not on file   Years of education: Not on file   Highest education level: Not  on file  Occupational History   Not on file  Social Needs   Financial resource strain: Not on file   Food insecurity    Worry: Not on file    Inability: Not on file   Transportation needs    Medical: Not on file    Non-medical: Not on file  Tobacco Use   Smoking status: Current Every Day Smoker    Packs/day: 0.25    Types: Cigarettes   Smokeless tobacco: Never Used   Tobacco comment: 5 cigs per day  Substance and Sexual Activity   Alcohol use: Yes    Comment: social, every 3-4 months   Drug use: Yes    Types: Marijuana, IV    Comment: As of 11/05/18: last used yesterday; previous IV drug use, last in Spring 2019.   Sexual activity: Yes    Birth control/protection: None  Lifestyle   Physical activity    Days  per week: Not on file    Minutes per session: Not on file   Stress: Not on file  Relationships   Social connections    Talks on phone: Not on file    Gets together: Not on file    Attends religious service: Not on file    Active member of club or organization: Not on file    Attends meetings of clubs or organizations: Not on file    Relationship status: Not on file  Other Topics Concern   Not on file  Social History Narrative   Not on file    Review of Systems: Complete ROS negative except as per HPI.  Physical Exam: There were no vitals taken for this visit. General:   Alert and oriented. Pleasant and cooperative. Well-nourished and well-developed.  Head:  Normocephalic and atraumatic. Eyes:  Without icterus, sclera clear and conjunctiva pink.  Ears:  Normal auditory acuity. Mouth:  No deformity or lesions, oral mucosa pink.  Throat/Neck:  Supple, without mass or thyromegaly. Cardiovascular:  S1, S2 present without murmurs appreciated. Normal pulses noted. Extremities without clubbing or edema. Respiratory:  Clear to auscultation bilaterally. No wheezes, rales, or rhonchi. No distress.  Gastrointestinal:  +BS, soft, non-tender and non-distended. No HSM noted. No guarding or rebound. No masses appreciated.  Rectal:  Deferred  Musculoskalatal:  Symmetrical without gross deformities. Normal posture. Skin:  Intact without significant lesions or rashes. Neurologic:  Alert and oriented x4;  grossly normal neurologically. Psych:  Alert and cooperative. Normal mood and affect. Heme/Lymph/Immune: No significant cervical adenopathy. No excessive bruising noted.    02/06/2019 8:19 AM   Disclaimer: This note was dictated with voice recognition software. Similar sounding words can inadvertently be transcribed and may not be corrected upon review.

## 2020-02-25 IMAGING — CT CT RENAL STONE PROTOCOL
2 of 4 series · 17 of 46 positions shown, 19 images · non-contrast
Comparison: CT abdomen pelvis 09/26/2015

CLINICAL DATA: Flank pain

EXAM:
CT ABDOMEN AND PELVIS WITHOUT CONTRAST
TECHNIQUE: Multidetector CT imaging of the abdomen and pelvis was performed
following the standard protocol without IV contrast.

[Series 2: axial st · axial · 0.79mm/px · z∈[+944,+1349]mm · 14 of 93 slices shown, 16 images]
[im 6/93  soft-tissue]
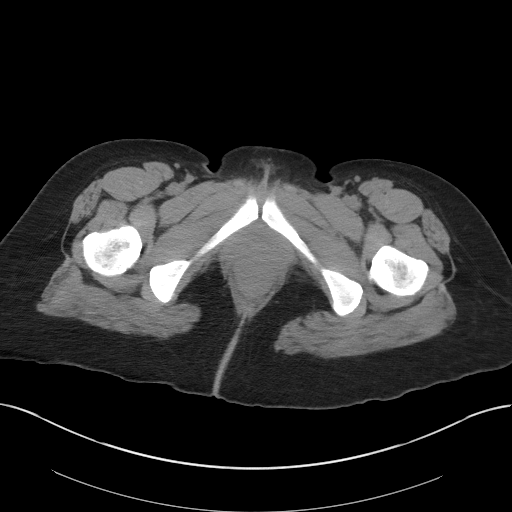
[im 6/93  bone]
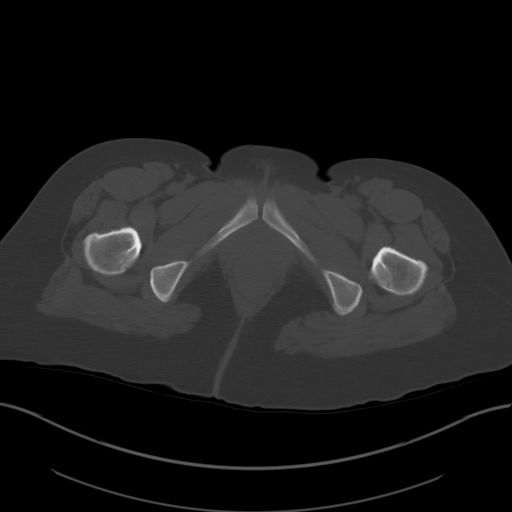
[im 11/93  soft-tissue]
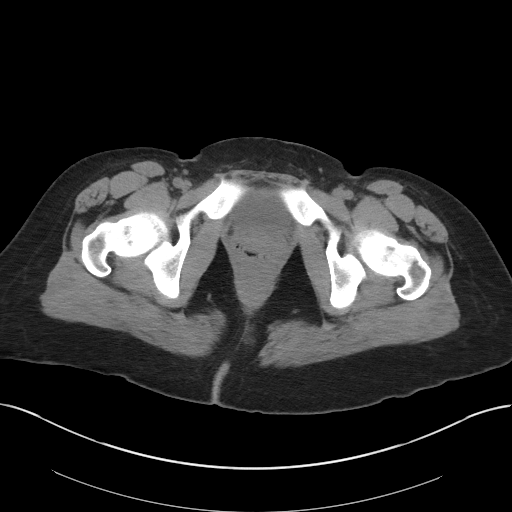
[im 21/93  soft-tissue]
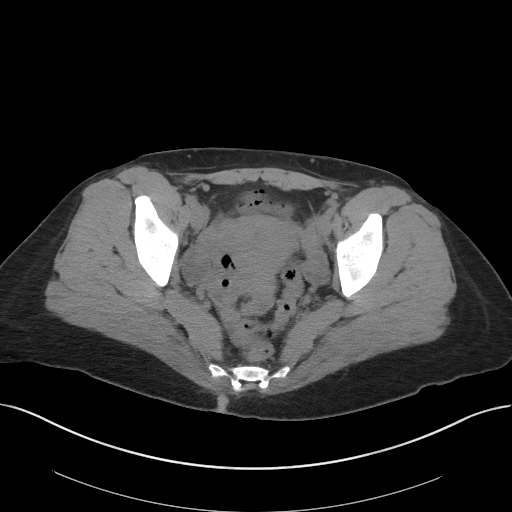
[im 26/93  soft-tissue]
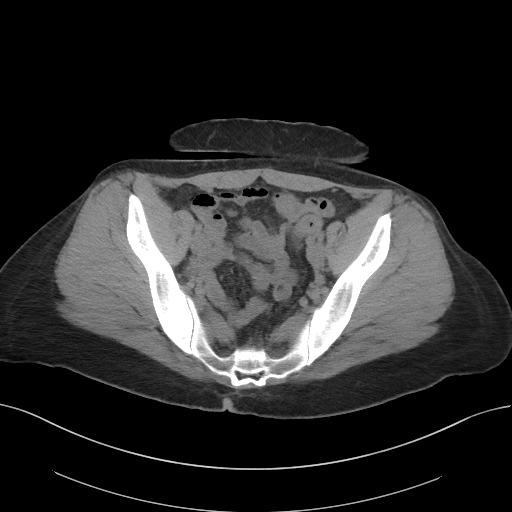
[im 31/93  soft-tissue]
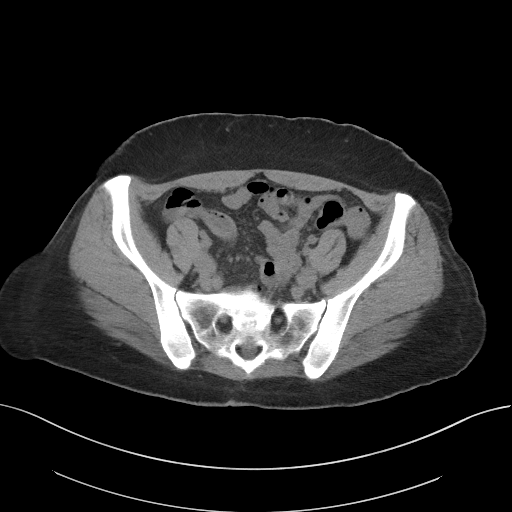
[im 36/93  soft-tissue]
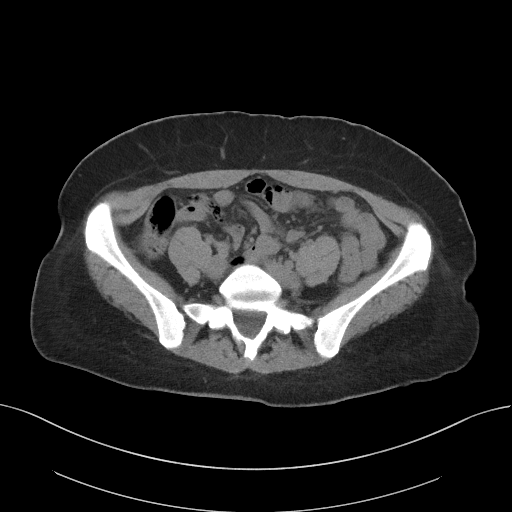
[im 41/93  soft-tissue]
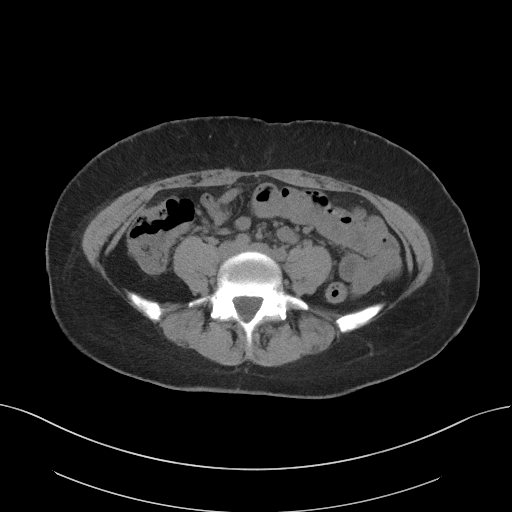
[im 52/93  soft-tissue]
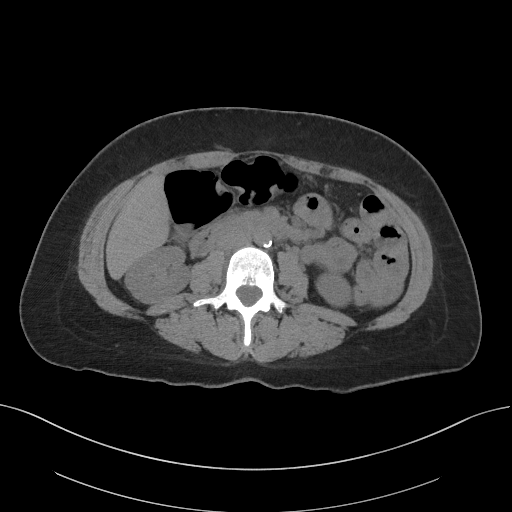
[im 57/93  soft-tissue]
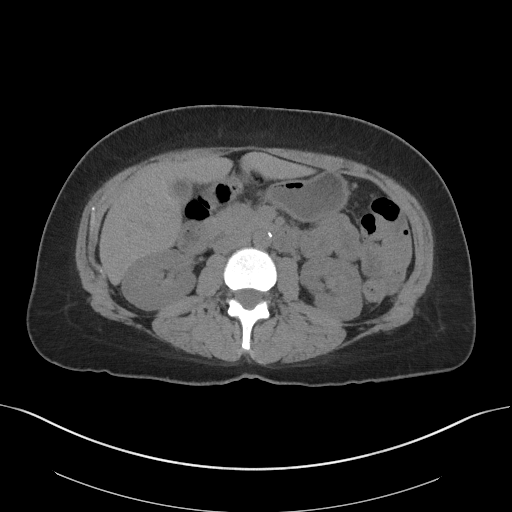
[im 57/93  bone]
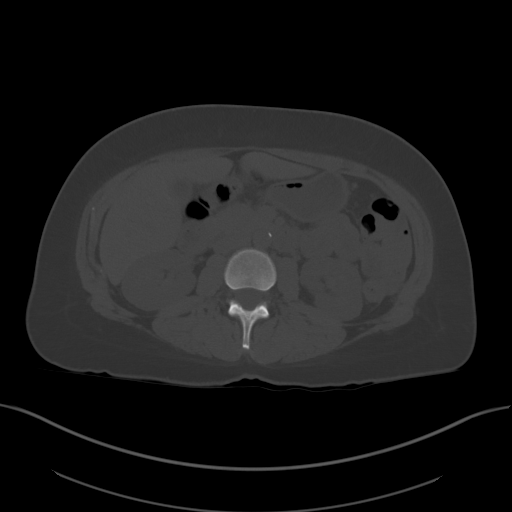
[im 62/93  soft-tissue]
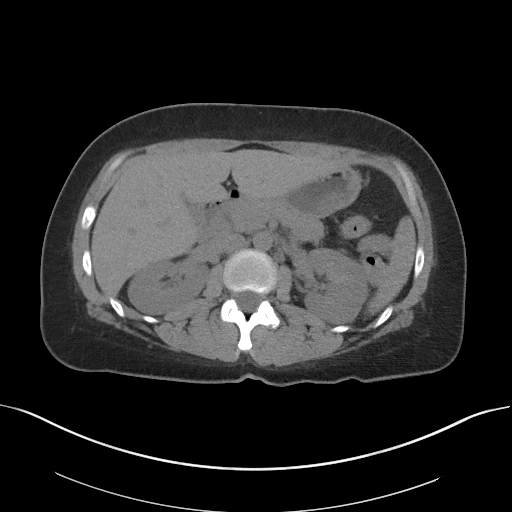
[im 67/93  soft-tissue]
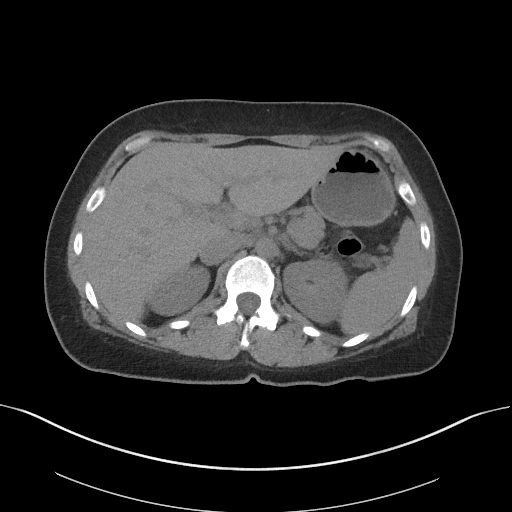
[im 72/93  soft-tissue]
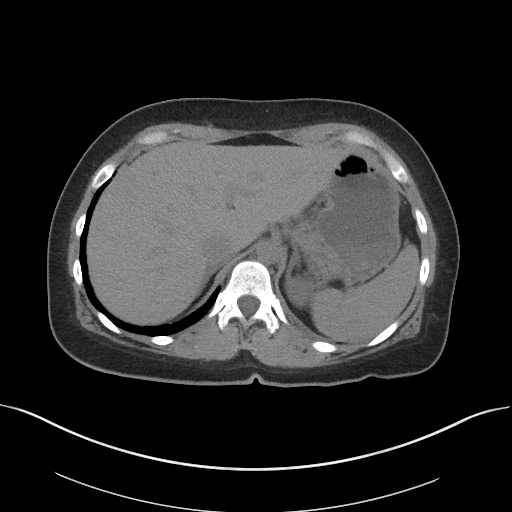
[im 82/93  soft-tissue]
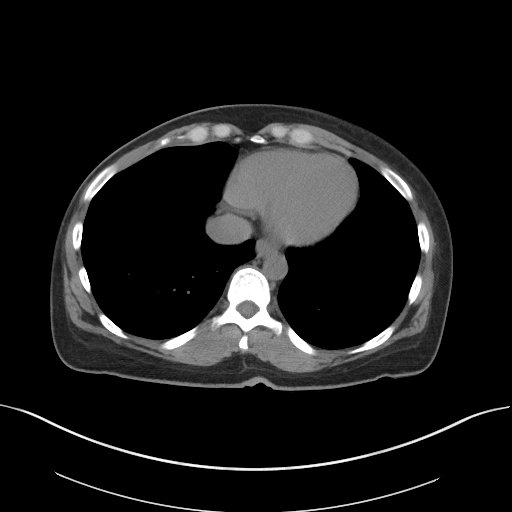
[im 87/93  soft-tissue]
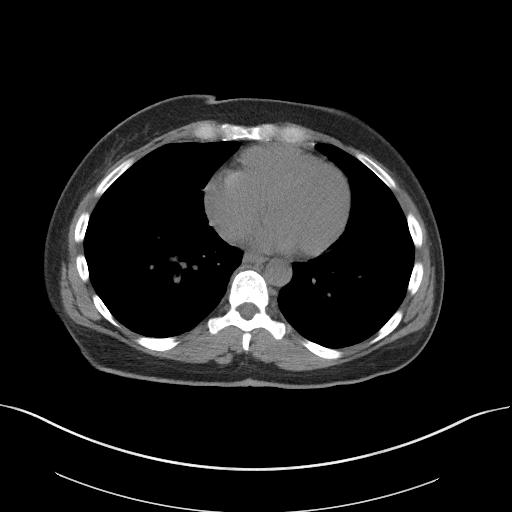

[Series 5: coronal st · coronal · 0.73mm/px · 3 of 93 slices shown]
[im 31/93  soft-tissue]
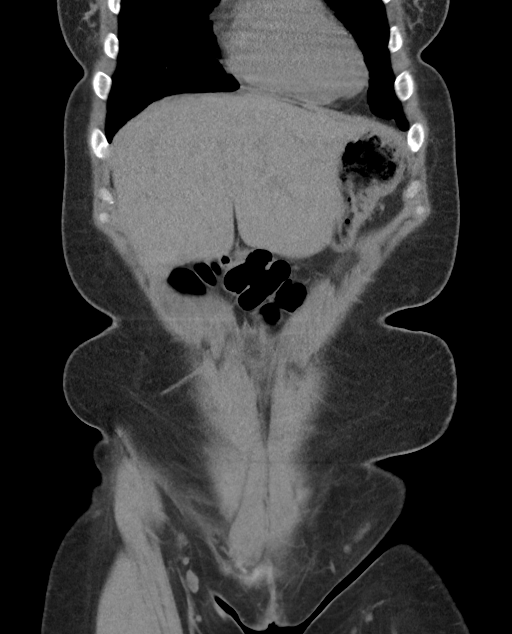
[im 41/93  soft-tissue]
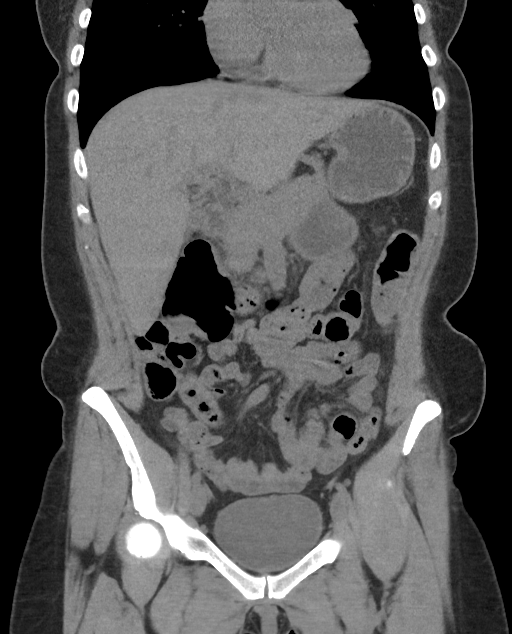
[im 52/93  soft-tissue]
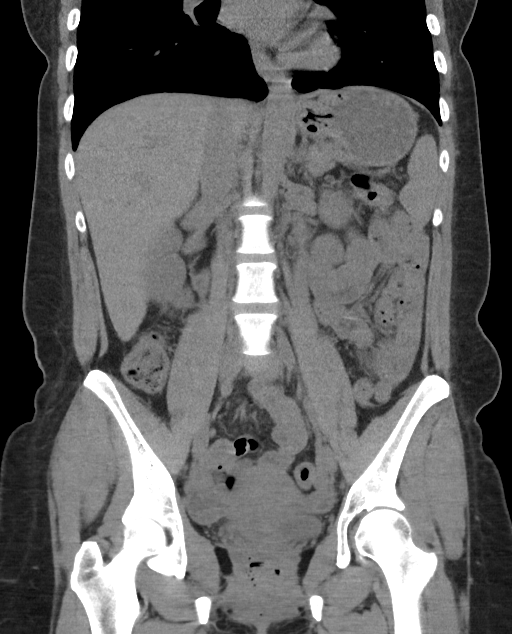

[17 of 46 positions shown; findings below may reference images not displayed]

FINDINGS: Lower chest: Lung bases clear bilaterally.

Hepatobiliary: Normal liver. Gallbladder contracted with mild
gallbladder wall thickening. No biliary dilatation.

Pancreas: Negative

Spleen: Negative

Adrenals/Urinary Tract: Small bilateral renal calculi. No renal
obstruction and no ureteral stone. Normal bladder.

Stomach/Bowel: No dilated bowel loops.  No bowel edema or mass.

Vascular/Lymphatic: Mild atherosclerotic aorta. Negative for
adenopathy.

Reproductive: No pelvic mass.

Other: No free fluid

Musculoskeletal: Negative
IMPRESSION: Small nonobstructing renal calculi bilaterally. No hydronephrosis.
Normal bladder.

Gallbladder wall thickening may be due to incomplete distention.
